# Patient Record
Sex: Female | Born: 1935 | Race: White | Hispanic: No | Smoking: Never smoker
Health system: Southern US, Community
[De-identification: ages and names within clinical notes are randomized; demographics above are authoritative.]

## PROBLEM LIST (undated history)

## (undated) DIAGNOSIS — J302 Other seasonal allergic rhinitis: Secondary | ICD-10-CM

## (undated) DIAGNOSIS — R4189 Other symptoms and signs involving cognitive functions and awareness: Secondary | ICD-10-CM

## (undated) DIAGNOSIS — S42309A Unspecified fracture of shaft of humerus, unspecified arm, initial encounter for closed fracture: Secondary | ICD-10-CM

## (undated) DIAGNOSIS — C801 Malignant (primary) neoplasm, unspecified: Secondary | ICD-10-CM

## (undated) DIAGNOSIS — M810 Age-related osteoporosis without current pathological fracture: Secondary | ICD-10-CM

## (undated) HISTORY — PX: EYE SURGERY: SHX253

## (undated) HISTORY — DX: Malignant (primary) neoplasm, unspecified: C80.1

## (undated) HISTORY — DX: Other seasonal allergic rhinitis: J30.2

## (undated) HISTORY — DX: Unspecified fracture of shaft of humerus, unspecified arm, initial encounter for closed fracture: S42.309A

## (undated) HISTORY — DX: Other symptoms and signs involving cognitive functions and awareness: R41.89

## (undated) HISTORY — PX: JOINT REPLACEMENT: SHX530

## (undated) HISTORY — PX: BACK SURGERY: SHX140

## (undated) HISTORY — PX: BREAST SURGERY: SHX581

## (undated) HISTORY — PX: APPENDECTOMY: SHX54

## (undated) HISTORY — DX: Age-related osteoporosis without current pathological fracture: M81.0

## (undated) HISTORY — PX: MASTECTOMY: SHX3

---

## 2014-12-12 ENCOUNTER — Encounter (INDEPENDENT_AMBULATORY_CARE_PROVIDER_SITE_OTHER): Payer: Self-pay | Admitting: Ophthalmology

## 2015-12-10 DIAGNOSIS — H43813 Vitreous degeneration, bilateral: Secondary | ICD-10-CM | POA: Diagnosis not present

## 2015-12-10 DIAGNOSIS — H353213 Exudative age-related macular degeneration, right eye, with inactive scar: Secondary | ICD-10-CM | POA: Diagnosis not present

## 2015-12-10 DIAGNOSIS — H35372 Puckering of macula, left eye: Secondary | ICD-10-CM | POA: Diagnosis not present

## 2015-12-10 DIAGNOSIS — H353221 Exudative age-related macular degeneration, left eye, with active choroidal neovascularization: Secondary | ICD-10-CM | POA: Diagnosis not present

## 2015-12-20 ENCOUNTER — Ambulatory Visit (INDEPENDENT_AMBULATORY_CARE_PROVIDER_SITE_OTHER): Payer: Medicare Other | Admitting: Family Medicine

## 2015-12-20 VITALS — BP 110/62 | HR 65 | Temp 98.4°F | Resp 14 | Ht 66.0 in | Wt 138.0 lb

## 2015-12-20 DIAGNOSIS — I951 Orthostatic hypotension: Secondary | ICD-10-CM | POA: Diagnosis not present

## 2015-12-20 DIAGNOSIS — R42 Dizziness and giddiness: Secondary | ICD-10-CM

## 2015-12-20 LAB — POCT CBC
Granulocyte percent: 59.7 %G (ref 37–80)
HCT, POC: 37.7 % (ref 37.7–47.9)
HEMOGLOBIN: 13.1 g/dL (ref 12.2–16.2)
LYMPH, POC: 1.6 (ref 0.6–3.4)
MCH, POC: 30.4 pg (ref 27–31.2)
MCHC: 34.7 g/dL (ref 31.8–35.4)
MCV: 87.6 fL (ref 80–97)
MID (cbc): 0.5 (ref 0–0.9)
MPV: 7.7 fL (ref 0–99.8)
PLATELET COUNT, POC: 194 10*3/uL (ref 142–424)
POC Granulocyte: 3 (ref 2–6.9)
POC LYMPH PERCENT: 30.4 %L (ref 10–50)
POC MID %: 9.9 % (ref 0–12)
RBC: 4.3 M/uL (ref 4.04–5.48)
RDW, POC: 14 %
WBC: 5.1 10*3/uL (ref 4.6–10.2)

## 2015-12-20 LAB — BASIC METABOLIC PANEL
BUN: 16 mg/dL (ref 7–25)
CHLORIDE: 101 mmol/L (ref 98–110)
CO2: 26 mmol/L (ref 20–31)
CREATININE: 0.73 mg/dL (ref 0.60–0.88)
Calcium: 9.9 mg/dL (ref 8.6–10.4)
Glucose, Bld: 139 mg/dL — ABNORMAL HIGH (ref 65–99)
POTASSIUM: 4.1 mmol/L (ref 3.5–5.3)
SODIUM: 137 mmol/L (ref 135–146)

## 2015-12-20 LAB — POC MICROSCOPIC URINALYSIS (UMFC)

## 2015-12-20 LAB — POCT URINALYSIS DIP (MANUAL ENTRY)
BILIRUBIN UA: NEGATIVE
BILIRUBIN UA: NEGATIVE
Blood, UA: NEGATIVE
Glucose, UA: NEGATIVE
LEUKOCYTES UA: NEGATIVE
NITRITE UA: NEGATIVE
Spec Grav, UA: 1.02
Urobilinogen, UA: 0.2
pH, UA: 6

## 2015-12-20 MED ORDER — ONDANSETRON 4 MG PO TBDP
4.0000 mg | ORAL_TABLET | Freq: Once | ORAL | Status: AC
  Administered 2015-12-20: 4 mg via ORAL

## 2015-12-20 MED ORDER — MECLIZINE HCL 25 MG PO TABS: 12.5000 mg | ORAL_TABLET | Freq: Three times a day (TID) | ORAL | Status: DC | PRN

## 2015-12-20 NOTE — Patient Instructions (Addendum)
IF you received an x-ray today, you will receive an invoice from [facility]. Please contact [facility] at [phone] with questions or concerns regarding your invoice.   IF you received labwork today, you will receive an invoice from [employer]. Please contact [facility] at [phone] with questions or concerns regarding your invoice.   Our billing staff will not be able to assist you with questions regarding bills from these companies.  You will be contacted with the lab results as soon as they are available. The fastest way to get your results is to activate your My Chart account. Instructions are located on the last page of this paperwork. If you have not heard from [place] regarding the results in 2 weeks, please contact this office. Vertigo Vertigo means you feel like you or your surroundings are moving when they are not. Vertigo can be dangerous if it occurs when you are at work, driving, or performing difficult activities.  CAUSES  Vertigo occurs when there is a conflict of signals sent to your brain from the visual and sensory systems in your body. There are many different causes of vertigo, including:  Infections, especially in the inner ear.  A bad reaction to a drug or misuse of alcohol and medicines.  Withdrawal from drugs or alcohol.  Rapidly changing positions, such as lying down or rolling over in bed.  A migraine headache.  Decreased blood flow to the brain.  Increased pressure in the brain from a head injury, infection, tumor, or bleeding. SYMPTOMS  You may feel as though the world is spinning around or you are falling to the ground. Because your balance is upset, vertigo can cause nausea and vomiting. You may have involuntary eye movements (nystagmus). DIAGNOSIS  Vertigo is usually diagnosed by physical exam. If the cause of your vertigo is unknown, your caregiver may perform imaging tests, such as an MRI scan (magnetic  resonance imaging). TREATMENT  Most cases of vertigo resolve on their own, without treatment. Depending on the cause, your caregiver may prescribe certain medicines. If your vertigo is related to body position issues, your caregiver may recommend movements or procedures to correct the problem. In rare cases, if your vertigo is caused by certain inner ear problems, you may need surgery. HOME CARE INSTRUCTIONS   Follow your caregiver's instructions.  Avoid driving.  Avoid operating heavy machinery.  Avoid performing any tasks that would be dangerous to you or others during a vertigo episode.  Tell your caregiver if you notice that certain medicines seem to be causing your vertigo. Some of the medicines used to treat vertigo episodes can actually make them worse in some people. SEEK IMMEDIATE MEDICAL CARE IF:   Your medicines do not relieve your vertigo or are making it worse.  You develop problems with talking, walking, weakness, or using your arms, hands, or legs.  You develop severe headaches.  Your nausea or vomiting continues or gets worse.  You develop visual changes.  A family member notices behavioral changes.  Your condition gets worse. MAKE SURE YOU:  Understand these instructions.  Will watch your condition.  Will get help right away if you are not doing well or get worse.   This information is not intended to replace advice given to you by your health care provider. Make sure you discuss any questions you have with your health care provider.   Document Released: 07/07/2005 Document Revised: 12/20/2011 Document Reviewed: 01/20/2015 Elsevier Interactive Patient Education 2016 Elsevier Inc.  Orthostatic Hypotension  Orthostatic hypotension is a sudden drop in blood pressure. It happens when you quickly stand up from a seated or lying position. You may feel dizzy or light-headed. This can last for just a few seconds or for up to a few minutes. It is usually not a  serious problem. However, if this happens frequently or gets worse, it can be a sign of something more serious. CAUSES  Different things can cause orthostatic hypotension, including:   Loss of body fluids (dehydration).  Medicines that lower blood pressure.  Sudden changes in posture, such as standing up quickly after you have been sitting or lying down.  Taking too much of your medicine. SIGNS AND SYMPTOMS   Light-headedness or dizziness.   Fainting or near-fainting.   A fast heart rate.   Weakness.   Feeling tired (fatigue).  DIAGNOSIS  Your health care provider may do several things to help diagnose your condition and identify the cause. These may include:   Taking a medical history and doing a physical exam.  Checking your blood pressure. Your health care provider will check your blood pressure when you are:  Lying down.  Sitting.  Standing.  Using tilt table testing. In this test, you lie down on a table that moves from a lying position to a standing position. You will be strapped onto the table. This test monitors your blood pressure and heart rate when you are in different positions. TREATMENT  Treatment will vary depending on the cause. Possible treatments include:   Changing the dosage of your medicines.  Wearing compression stockings on your lower legs.  Standing up slowly after sitting or lying down.  Eating more salt.  Eating frequent, small meals.  In some cases, getting IV fluids.  Taking medicine to enhance fluid retention. HOME CARE INSTRUCTIONS  Only take over-the-counter or prescription medicines as directed by your health care provider.  Follow your health care provider's instructions for changing the dosage of your current medicines.  Do not stop or adjust your medicine on your own.  Stand up slowly after sitting or lying down. This allows your body to adjust to the different position.  Wear compression stockings as  directed.  Eat extra salt as directed.  Do not add extra salt to your diet unless directed to by your health care provider.  Eat frequent, small meals.  Avoid standing suddenly after eating.  Avoid hot showers or excessive heat as directed by your health care provider.  Keep all follow-up appointments. SEEK MEDICAL CARE IF:  You continue to feel dizzy or light-headed after standing.  You feel groggy or confused.  You feel cold, clammy, or sick to your stomach (nauseous).  You have blurred vision.  You feel short of breath. SEEK IMMEDIATE MEDICAL CARE IF:   You faint after standing.  You have chest pain.  You have difficulty breathing.   You lose feeling or movement in your arms or legs.   You have slurred speech or difficulty talking, or you are unable to talk.  MAKE SURE YOU:   Understand these instructions.  Will watch your condition.  Will get help right away if you are not doing well or get worse.   This information is not intended to replace advice given to you by your health care provider. Make sure you discuss any questions you have with your health care provider.   Document Released: 09/17/2002 Document Revised: 10/02/2013 Document Reviewed: 07/20/2013 Elsevier Interactive Patient Education 2016 Elsevier Inc. Dehydration Dehydration is when  you lose more fluids from the body than you take in. Vital organs such as the kidneys, brain, and heart cannot function without a proper amount of fluids and salt. Any loss of fluids from the body can cause dehydration.  Older adults are at a higher risk of dehydration than younger adults. As we age, our bodies are less able to conserve water and do not respond to temperature changes as well. Also, older adults do not become thirsty as easily or quickly. Because of this, older adults often do not realize they need to increase fluids to avoid dehydration.  CAUSES   Vomiting.  Diarrhea.  Excessive  sweating.  Excessive urination.  Fever.  Certain medicines, such as blood pressure medicines called diuretics.  Poorly controlled blood sugars. SIGNS AND SYMPTOMS  Mild dehydration:  Thirst.  Dry lips.  Slightly dry mouth. Moderate dehydration:  Very dry mouth.  Sunken eyes.  Skin does not bounce back quickly when lightly pinched and released.  Dark urine and decreased urine production.  Decreased tear production.  Headache. Severe dehydration:  Very dry mouth.  Extreme thirst.  Rapid, weak pulse (more than 100 beats per minute at rest).  Cold hands and feet.  Not able to sweat in spite of heat.  Rapid breathing.  Blue lips.  Confusion and lethargy.  Difficulty being awakened.  Minimal urine production.  No tears. DIAGNOSIS  Your health care provider will diagnose dehydration based on your symptoms and your exam. Blood and urine tests will help confirm the diagnosis. The diagnostic evaluation should also identify the cause of dehydration. TREATMENT  Treatment of mild or moderate dehydration can often be done at home by increasing the amount of fluids that you drink. It is best to drink small amounts of fluid more often. Drinking too much at one time can make vomiting worse. Severe dehydration needs to be treated at the hospital. You may be given IV fluids that contain water and electrolytes. HOME CARE INSTRUCTIONS   Ask your health care provider about specific rehydration instructions.  Drink enough fluids to keep your urine clear or pale yellow.  Drink small amounts frequently if you have nausea and vomiting.  Eat as you normally do.  Avoid:  Foods or drinks high in sugar.  Carbonated drinks.  Juice.  Extremely hot or cold fluids.  Drinks with caffeine.  Fatty, greasy foods.  Alcohol.  Tobacco.  Overeating.  Gelatin desserts.  Wash your hands well to avoid spreading bacteria and viruses.  Only take over-the-counter or  prescription medicines for pain, discomfort, or fever as directed by your health care provider.  Ask your health care provider if you should continue all prescribed and over-the-counter medicines.  Keep all follow-up appointments with your health care provider. SEEK MEDICAL CARE IF:  You have abdominal pain, and it increases or stays in one area (localizes).  You have a rash, stiff neck, or severe headache.  You are irritable, sleepy, or difficult to awaken.  You are weak, dizzy, or extremely thirsty.  You have a fever. SEEK IMMEDIATE MEDICAL CARE IF:   You are unable to keep fluids down, or you get worse despite treatment.  You have frequent episodes of vomiting or diarrhea.  You have blood or green matter (bile) in your vomit.  You have blood in your stool, or your stool looks black and tarry.  You have not urinated in 6-8 hours, or you have only urinated a small amount of very dark urine.  You faint. MAKE  SURE YOU:   Understand these instructions.  Will watch your condition.  Will get help right away if you are not doing well or get worse.   This information is not intended to replace advice given to you by your health care provider. Make sure you discuss any questions you have with your health care provider.   Document Released: 12/18/2003 Document Revised: 10/02/2013 Document Reviewed: 06/04/2013 Elsevier Interactive Patient Education [employer].

## 2015-12-20 NOTE — Progress Notes (Addendum)
Subjective:  By signing my name below, I, Raven Small, attest that this documentation has been prepared under the direction and in the presence of Delman Cheadle, MD.  Electronically Signed: Thea Alken, ED Scribe. 12/20/2015. 1:34 PM.   Patient ID: Rebecca Munoz, female    DOB: [birth year], 82 y.o.   MRN: [id number]  HPI Chief Complaint  Patient presents with  . Dizziness    1 day  . Fatigue    light headed last night    HPI Comments: Rebecca Munoz is a 82 y.o. female who presents to the [facility] complaining of dizziness that began yesterday. Pt states while exiting an elevator yesterday her legs felt "rubberish" and felt the need to grab the wall for stability. After she got up out of bed this morning she had worsening dizziness. She still has a dizzy sensation but not as bad as episode this morning. Dizziness is not positional. It does make her somewhat nausea.  She also reports some fatigue.  She denies fever, chills, decrease appetite, muffled hearing, CP, palpitation, SOB, urinary symptoms. Pt is healthy otherwise without medical problems. She does not take any prescription medication.    There are no active problems to display for this patient.  Past Medical History  Diagnosis Date  . Cancer [facility])     breast  . Osteoporosis    Past Surgical History  Procedure Laterality Date  . Eye surgery    . Brain surgery Right     lumpectomy  . Back surgery    . Appendectomy    . Joint replacement     No Known Allergies Prior to Admission medications   Medication Sig Start Date End Date Taking? Authorizing Provider  aspirin 81 MG tablet Take 81 mg by mouth daily.   Yes Historical Provider, MD  ibuprofen (ADVIL,MOTRIN) 200 MG tablet Take 200 mg by mouth every 6 (six) hours as needed.   Yes Historical Provider, MD  Multiple Vitamins-Minerals (MULTIVITAMIN WITH MINERALS) tablet Take 1 tablet by mouth daily.   Yes Historical Provider, MD   Social History   Social  History  . Marital Status: Widowed    Spouse Name: N/A  . Number of Children: N/A  . Years of Education: N/A   Occupational History  . Not on file.   Social History Main Topics  . Smoking status: Never Smoker   . Smokeless tobacco: Not on file  . Alcohol Use: No  . Drug Use: No  . Sexual Activity: Not on file   Other Topics Concern  . Not on file   Social History Narrative  . No narrative on file   Review of Systems  Constitutional: Positive for fatigue. Negative for fever, chills and appetite change.  HENT: Negative for ear pain and hearing loss.   Respiratory: Negative for chest tightness and shortness of breath.   Cardiovascular: Negative for chest pain and palpitations.  Gastrointestinal: Positive for nausea. Negative for vomiting.  Genitourinary: Negative for difficulty urinating.  Neurological: Positive for dizziness.   Objective:   Physical Exam  Constitutional: She is oriented to person, place, and time. She appears well-developed and well-nourished. No distress.  HENT:  Head: Normocephalic and atraumatic.  Right Ear: Hearing, tympanic membrane, external ear and ear canal normal.  Left Ear: Hearing, tympanic membrane, external ear and ear canal normal.  Nose: Nose normal.  Mouth/Throat: Oropharynx is clear and moist.  Eyes: Conjunctivae and EOM are normal.  Neck: Normal range of motion. Neck  supple. No thyromegaly present.  Cardiovascular: Normal rate, regular rhythm and normal heart sounds.   No murmur heard. Pulmonary/Chest: Effort normal.  Abdominal: Soft. Bowel sounds are normal. She exhibits no distension. There is no tenderness.  Musculoskeletal: Normal range of motion.  Lymphadenopathy:    She has no cervical adenopathy.  Neurological: She is alert and oriented to person, place, and time.  Skin: Skin is warm and dry.  Psychiatric: She has a normal mood and affect. Her behavior is normal.  Nursing note and vitals reviewed.  Filed Vitals:   12/20/15  1315  BP: 110/62  Pulse: 65  Temp: 98.4 F (36.9 C)  TempSrc: Oral  Resp: 14  Height: 5\' 6"  (1.676 m)  Weight: 138 lb (62.596 kg)  SpO2: 96%   Results for orders placed or performed in visit on 12/20/15  POCT urinalysis dipstick  Result Value Ref Range   Color, UA yellow yellow   Clarity, UA clear clear   Glucose, UA negative negative   Bilirubin, UA negative negative   Ketones, POC UA negative negative   Spec Grav, UA 1.020    Blood, UA negative negative   pH, UA 6.0    Protein Ur, POC trace (A) negative   Urobilinogen, UA 0.2    Nitrite, UA Negative Negative   Leukocytes, UA Negative Negative  POCT Microscopic Urinalysis ([facility])  Result Value Ref Range   WBC,UR,HPF,POC None None WBC/hpf   RBC,UR,HPF,POC None None RBC/hpf   Bacteria Few (A) None, Too numerous to count   Mucus Present (A) Absent   Epithelial Cells, UR Per Microscopy Few (A) None, Too numerous to count cells/hpf   Crystals FEW   POCT CBC  Result Value Ref Range   WBC 5.1 4.6 - 10.2 K/uL   Lymph, poc 1.6 0.6 - 3.4   POC LYMPH PERCENT 30.4 10 - 50 %L   MID (cbc) 0.5 0 - 0.9   POC MID % 9.9 0 - 12 %M   POC Granulocyte 3.0 2 - 6.9   Granulocyte percent 59.7 37 - 80 %G   RBC 4.30 4.04 - 5.48 M/uL   Hemoglobin 13.1 12.2 - 16.2 g/dL   HCT, POC 37.7 37.7 - 47.9 %   MCV 87.6 80 - 97 fL   MCH, POC 30.4 27 - 31.2 pg   MCHC 34.7 31.8 - 35.4 g/dL   RDW, POC 14.0 %   Platelet Count, POC 194 142 - 424 K/uL   MPV 7.7 0 - 99.8 fL   Orthostatic VS for the past 24 hrs:  BP- Lying Pulse- Lying BP- Sitting Pulse- Sitting BP- Standing at 0 minutes Pulse- Standing at 0 minutes  12/20/15 1415 147/70 mmHg 60 136/72 mmHg 64 119/72 mmHg 74    EKG-Normal sinus rhythm no ischemic change.   Assessment & Plan:   1. Dizziness and giddiness   2. Orthostatic hypotension   3. Vertigo - suspect benign BPPV - sxs mild and improved with zofran in office today. Start sched mecliizine bid x sev d and can increase prn. Recheck  in sev d.    Orders Placed This Encounter  Procedures  . Basic metabolic panel    Order Specific Question:  Has the patient fasted?    Answer:  No  . Orthostatic vital signs  . POCT urinalysis dipstick  . POCT Microscopic Urinalysis ([facility])  . POCT CBC  . EKG 12-Lead    Meds ordered this encounter  Medications  . ibuprofen (ADVIL,MOTRIN) 200 MG tablet  Sig: Take 200 mg by mouth every 6 (six) hours as needed.  . Multiple Vitamins-Minerals (MULTIVITAMIN WITH MINERALS) tablet    Sig: Take 1 tablet by mouth daily.  Marland Kitchen aspirin 81 MG tablet    Sig: Take 81 mg by mouth daily.  . ondansetron (ZOFRAN-ODT) disintegrating tablet 4 mg    Sig:   . meclizine (ANTIVERT) 25 MG tablet    Sig: Take 0.5-1 tablets (12.5-25 mg total) by mouth 3 (three) times daily as needed for dizziness.    Dispense:  30 tablet    Refill:  0    I personally performed the services described in this documentation, which was scribed in my presence. The recorded information has been reviewed and considered, and addended by me as needed.  Delman Cheadle, MD MPH

## 2015-12-22 ENCOUNTER — Encounter: Payer: Self-pay | Admitting: Family Medicine

## 2015-12-24 ENCOUNTER — Ambulatory Visit (INDEPENDENT_AMBULATORY_CARE_PROVIDER_SITE_OTHER): Payer: Medicare Other | Admitting: Physician Assistant

## 2015-12-24 VITALS — BP 144/80 | HR 74 | Temp 98.4°F | Resp 17 | Ht 66.0 in | Wt 141.0 lb

## 2015-12-24 DIAGNOSIS — I951 Orthostatic hypotension: Secondary | ICD-10-CM | POA: Diagnosis not present

## 2015-12-24 DIAGNOSIS — R42 Dizziness and giddiness: Secondary | ICD-10-CM | POA: Diagnosis not present

## 2015-12-24 NOTE — Patient Instructions (Addendum)
I would stop the meclizine and now just use it if you need it for dizziness.  You are doing do much better.  Continue using the walker until you feel your leg steadiness is back.  If you have the symptoms return please RTC because at that time we may want to do further testing.  IF you received an x-ray today, you will receive an invoice from [facility]. Please contact [facility] at [phone] with questions or concerns regarding your invoice.   IF you received labwork today, you will receive an invoice from [employer]. Please contact [facility] at [phone] with questions or concerns regarding your invoice.   Our billing staff will not be able to assist you with questions regarding bills from these companies.  You will be contacted with the lab results as soon as they are available. The fastest way to get your results is to activate your My Chart account. Instructions are located on the last page of this paperwork. If you have not heard from [place] regarding the results in 2 weeks, please contact this office.

## 2015-12-24 NOTE — Progress Notes (Signed)
Rebecca Munoz  MRN: [id number] DOB: 10/14/1920  Subjective:  Pt presents to clinic for a recheck.  She feels better. She is not having more more dizzy spells but her legs still feel rubbery.  She is using a walker which she has never used before and it is "cramping her style" but she would rather use it and be safe. She has been doing treadmill walking the last several mornings - she has to hold on but she has been able to do her daily exercise.  She is still having no URI symptoms.  Her nausea has resolved.  She has been using the medication and she has been tolerating it ok but she is not sure when to stop it.  She is having no paresthesias or numbness in legs or arms.  No headaches.  She has a history right hip irritation and she sits on ice each day.  She feels like her legs are both rubbery even with that history of her leg/hip injury.    There are no active problems to display for this patient.   Current Outpatient Prescriptions on File Prior to Visit  Medication Sig Dispense Refill  . aspirin 81 MG tablet Take 81 mg by mouth daily.    Marland Kitchen ibuprofen (ADVIL,MOTRIN) 200 MG tablet Take 200 mg by mouth every 6 (six) hours as needed. Reported on 12/24/2015    . meclizine (ANTIVERT) 25 MG tablet Take 0.5-1 tablets (12.5-25 mg total) by mouth 3 (three) times daily as needed for dizziness. 30 tablet 0  . Multiple Vitamins-Minerals (MULTIVITAMIN WITH MINERALS) tablet Take 1 tablet by mouth daily.     No current facility-administered medications on file prior to visit.    No Known Allergies  Review of Systems  HENT: Negative.   Neurological: Positive for weakness (legs only). Negative for dizziness, syncope and headaches.   Objective:  BP 144/80 mmHg  Pulse 74  Temp(Src) 98.4 F (36.9 C) (Oral)  Resp 17  Ht 5\' 6"  (1.676 m)  Wt 141 lb (63.957 kg)  BMI 22.77 kg/m2  SpO2 95%  Physical Exam  Constitutional: She is oriented to person, place, and time and well-developed, well-nourished,  and in no distress.  HENT:  Head: Normocephalic and atraumatic.  Right Ear: Hearing and external ear normal.  Left Ear: Hearing and external ear normal.  Eyes: Conjunctivae are normal.  Neck: Normal range of motion.  Cardiovascular: Normal rate, regular rhythm and normal heart sounds.   No murmur heard. Pulmonary/Chest: Effort normal and breath sounds normal. She has no wheezes.  Neurological: She is alert and oriented to person, place, and time. She has normal motor skills. She has a normal Romberg Test. She shows no pronator drift. Gait normal.  Skin: Skin is warm and dry.  Psychiatric: Mood, memory, affect and judgment normal.  Vitals reviewed.   Orthostatic VS for the past 24 hrs:  BP- Lying Pulse- Lying BP- Sitting Pulse- Sitting BP- Standing at 0 minutes Pulse- Standing at 0 minutes  12/24/15 1027 144/71 mmHg 60 140/81 mmHg 64 140/69 mmHg 72    Assessment and Plan :  Dizziness and giddiness  Orthostatic hypotension - Plan: Orthostatic vital signs   Reviewed normal labs with the patient.  She is much better today.  She asks about a CT scan and I think currently the risk of radiation is more than the possibility of finding something esp since she has a normal exam and her symptoms of dizziness have resolve and the "rubbery legs" are  better.  She will now she the meclizine prn and if the dizzy feeling returns she will RTC and at that time we may decided that a CT is necessary.  She understands and agrees with the plan.    Windell Hummingbird PA-C  [facility] 12/24/2015 10:47 AM

## 2016-02-03 DIAGNOSIS — H3530 Unspecified macular degeneration: Secondary | ICD-10-CM | POA: Diagnosis not present

## 2016-02-03 DIAGNOSIS — K219 Gastro-esophageal reflux disease without esophagitis: Secondary | ICD-10-CM | POA: Diagnosis not present

## 2016-02-03 DIAGNOSIS — R32 Unspecified urinary incontinence: Secondary | ICD-10-CM | POA: Diagnosis not present

## 2016-02-03 DIAGNOSIS — G3184 Mild cognitive impairment, so stated: Secondary | ICD-10-CM | POA: Diagnosis not present

## 2016-02-03 DIAGNOSIS — M545 Low back pain: Secondary | ICD-10-CM | POA: Diagnosis not present

## 2016-02-03 DIAGNOSIS — M5412 Radiculopathy, cervical region: Secondary | ICD-10-CM | POA: Diagnosis not present

## 2016-02-03 DIAGNOSIS — L570 Actinic keratosis: Secondary | ICD-10-CM | POA: Diagnosis not present

## 2016-02-11 DIAGNOSIS — E559 Vitamin D deficiency, unspecified: Secondary | ICD-10-CM | POA: Diagnosis not present

## 2016-02-11 DIAGNOSIS — Z79899 Other long term (current) drug therapy: Secondary | ICD-10-CM | POA: Diagnosis not present

## 2016-03-17 DIAGNOSIS — H35372 Puckering of macula, left eye: Secondary | ICD-10-CM | POA: Diagnosis not present

## 2016-03-17 DIAGNOSIS — H43813 Vitreous degeneration, bilateral: Secondary | ICD-10-CM | POA: Diagnosis not present

## 2016-03-17 DIAGNOSIS — H353213 Exudative age-related macular degeneration, right eye, with inactive scar: Secondary | ICD-10-CM | POA: Diagnosis not present

## 2016-03-17 DIAGNOSIS — H353222 Exudative age-related macular degeneration, left eye, with inactive choroidal neovascularization: Secondary | ICD-10-CM | POA: Diagnosis not present

## 2016-04-06 DIAGNOSIS — D229 Melanocytic nevi, unspecified: Secondary | ICD-10-CM | POA: Diagnosis not present

## 2016-04-06 DIAGNOSIS — H9190 Unspecified hearing loss, unspecified ear: Secondary | ICD-10-CM | POA: Diagnosis not present

## 2016-04-06 DIAGNOSIS — M5416 Radiculopathy, lumbar region: Secondary | ICD-10-CM | POA: Diagnosis not present

## 2016-04-06 DIAGNOSIS — M545 Low back pain: Secondary | ICD-10-CM | POA: Diagnosis not present

## 2016-04-06 DIAGNOSIS — R413 Other amnesia: Secondary | ICD-10-CM | POA: Diagnosis not present

## 2016-04-06 DIAGNOSIS — K319 Disease of stomach and duodenum, unspecified: Secondary | ICD-10-CM | POA: Diagnosis not present

## 2016-04-06 DIAGNOSIS — H3530 Unspecified macular degeneration: Secondary | ICD-10-CM | POA: Diagnosis not present

## 2016-04-06 DIAGNOSIS — Z7409 Other reduced mobility: Secondary | ICD-10-CM | POA: Diagnosis not present

## 2016-04-22 DIAGNOSIS — L820 Inflamed seborrheic keratosis: Secondary | ICD-10-CM | POA: Diagnosis not present

## 2016-04-22 DIAGNOSIS — Z79899 Other long term (current) drug therapy: Secondary | ICD-10-CM | POA: Diagnosis not present

## 2016-04-22 DIAGNOSIS — D229 Melanocytic nevi, unspecified: Secondary | ICD-10-CM | POA: Diagnosis not present

## 2016-04-22 DIAGNOSIS — M545 Low back pain: Secondary | ICD-10-CM | POA: Diagnosis not present

## 2016-06-04 ENCOUNTER — Ambulatory Visit (INDEPENDENT_AMBULATORY_CARE_PROVIDER_SITE_OTHER): Payer: Medicare Other

## 2016-06-04 ENCOUNTER — Ambulatory Visit (INDEPENDENT_AMBULATORY_CARE_PROVIDER_SITE_OTHER): Payer: Medicare Other | Admitting: Physician Assistant

## 2016-06-04 ENCOUNTER — Other Ambulatory Visit: Payer: Self-pay

## 2016-06-04 VITALS — BP 122/52 | HR 44 | Temp 98.1°F | Resp 16 | Ht 65.0 in | Wt 140.8 lb

## 2016-06-04 DIAGNOSIS — M533 Sacrococcygeal disorders, not elsewhere classified: Secondary | ICD-10-CM | POA: Diagnosis not present

## 2016-06-04 DIAGNOSIS — M545 Low back pain: Secondary | ICD-10-CM

## 2016-06-04 DIAGNOSIS — S79911A Unspecified injury of right hip, initial encounter: Secondary | ICD-10-CM | POA: Diagnosis not present

## 2016-06-04 MED ORDER — MELOXICAM 7.5 MG PO TABS: 7.5000 mg | ORAL_TABLET | Freq: Every day | ORAL | 0 refills | Status: DC

## 2016-06-04 MED ORDER — METAXALONE 400 MG PO TABS: 400.0000 mg | ORAL_TABLET | Freq: Three times a day (TID) | ORAL | 0 refills | Status: DC

## 2016-06-04 NOTE — Progress Notes (Addendum)
[facility] [address], [place] [address] [phone] By signing my name below, I, Mesha Guinyard, attest that this documentation has been prepared under the direction and in the presence of Treatment Team:  Attending Provider: Wardell Honour, MD Physician Assistant: Joretta Bachelor, [place].  Electronically Signed: Verlee Monte, Medical Scribe. 06/04/16. 2:36 PM.  Date:  06/04/2016   Name:  Rebecca Munoz   DOB:  09-23-1921   MRN:  [id number]  PCP:  No PCP Per Patient   Chief Complaint  Patient presents with   Hip Pain    Right hip   History of Present Illness:  Rebecca Munoz is a 82 y.o. female patient who presents to [facility] complaining of old hip injury. Pt fell out of a pick up truck and landed on cement when she was 82 y/o. Pt mentions she wasn't "terribly" injured then and wasn't bed ridden. Pt mentions she had a broken hip when she was 82 y/o, 2 back surgeries on that side that inserted pins, and her spine is fused at L4-5 around 1973. Pt reports pain that radiates to her right knee, an increase in instability in her when she walks. Pt reports right knee pain but mentions she's had injuries to her knees at the past. Pt has been using a walker more frequently this month to maintain her balance, and rolling over is a problem at night. 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 Pt has been going to a chiropractor for her symptoms, but thinks she needs more help.   There are no active problems to display for this patient.   Past Medical History:  Diagnosis Date   Cancer [facility])    breast   Osteoporosis     Past Surgical History:  Procedure Laterality Date   APPENDECTOMY     BACK SURGERY     BRAIN SURGERY Right    lumpectomy   EYE SURGERY     JOINT REPLACEMENT      Social History  Substance Use Topics   Smoking status: Never Smoker   Smokeless tobacco: Never Used    Alcohol use No    History reviewed. No pertinent family history.  No Known Allergies  Medication list has been reviewed and updated.  Current Outpatient Prescriptions on File Prior to Visit  Medication Sig Dispense Refill   aspirin 81 MG tablet Take 81 mg by mouth daily.     Multiple Vitamins-Minerals (MULTIVITAMIN WITH MINERALS) tablet Take 1 tablet by mouth daily.     meclizine (ANTIVERT) 25 MG tablet Take 0.5-1 tablets (12.5-25 mg total) by mouth 3 (three) times daily as needed for dizziness. (Patient not taking: Reported on 06/04/2016) 30 tablet 0   No current facility-administered medications on file prior to visit.     Review of Systems  Musculoskeletal: Positive for back pain.       Arthralgias  ROS otherwise unremarkable unless listed above.   Physical Examination: BP (!) 122/52 (BP Location: Right Arm, Patient Position: Sitting, Cuff Size: Small)    Pulse (!) 44    Temp 98.1 F (36.7 C) (Oral)    Resp 16    Ht 5\' 5"  (1.651 m)    Wt 140 lb 12.8 oz (63.9 kg)    SpO2 96%    BMI 23.43 kg/m  Ideal Body Weight: @FLOWAMB [id number]  Physical Exam  Constitutional: She appears well-developed and well-nourished. No distress.  HENT:  Head:  Normocephalic and atraumatic.  Eyes: Conjunctivae are normal.  Neck: Neck supple.  Cardiovascular: Normal rate and intact distal pulses.   Pulses:      Dorsalis pedis pulses are 2+ on the right side, and 2+ on the left side.  Pulmonary/Chest: Effort normal.  Musculoskeletal:  Tenderness along the SI joint on the right side Sacral tenderness  Neurological: She is alert.  Skin: Skin is warm and dry.  Psychiatric: She has a normal mood and affect. Her behavior is normal.  Nursing note and vitals reviewed.  Dg Sacrum/coccyx  Result Date: 06/04/2016 CLINICAL DATA:  Back pain. EXAM: SACRUM AND COCCYX - 2+ VIEW COMPARISON:  None. FINDINGS: No fracture.  No bone lesion. SI joints are normally spaced and aligned. Bones are diffusely  demineralized. IMPRESSION: No fracture. Electronically Signed   By: Lajean Manes M.D.   On: 06/04/2016 15:37   Dg Hip Unilat W Or W/o Pelvis 2-3 Views Right  Result Date: 06/04/2016 CLINICAL DATA:  82 y.o. female patient who presents to [facility] complaining of old hip injury. Pt fell out of a pick up truck and landed on cement when she was 82 y/o. EXAM: DG HIP (WITH OR WITHOUT PELVIS) 2-3V RIGHT COMPARISON:  None. FINDINGS: No acute fracture.  No bone lesion. An intra medullary rod supports 2 screws, which reduced the old proximal right femur fracture. The fractures well-healed. The orthopedic hardware appears well-seated without evidence of loosening. Hip joints, SI joints and symphysis pubis are normally aligned. Bones are diffusely demineralized. IMPRESSION: 1. No fracture or acute finding. 2. No evidence of loosening of the right proximal femur orthopedic hardware. 3. No significant hip joint abnormality. Electronically Signed   By: Lajean Manes M.D.   On: 06/04/2016 15:39   Assessment and Plan: Rebecca Munoz is a 82 y.o. female who is here today for low back pain. Advised nsaid use short term, and skelaxin.  Advised precaution.  She will continue with physical therapy at her assistant living quarters.  If sxs do not improve, we can refer to ortho.  Low back pain without sciatica, unspecified back pain laterality - Plan: DG Sacrum/Coccyx, DG HIP UNILAT W OR W/O PELVIS 2-3 VIEWS RIGHT, metaxalone (SKELAXIN) 400 MG tablet, meloxicam (MOBIC) 7.5 MG tablet, CANCELED: DG Si Joints  Ivar Drape, PA-C [facility] 06/04/2016 2:36 PM  I personally performed the services described in this documentation, which was scribed in my presence. The recorded information has been reviewed and is accurate.

## 2016-06-04 NOTE — Patient Instructions (Addendum)
Please ice the area as you have done, 3 times per day. We can do the skelaxin.  I want you to be careful with walking or moving around quickly. You can take the mobic as needed.  Please do not take ibuprofen or naproxen when you take this.  You can take tylenol.  IF you received an x-ray today, you will receive an invoice from [facility]. Please contact [facility] at [phone] with questions or concerns regarding your invoice.   IF you received labwork today, you will receive an invoice from [employer]. Please contact [facility] at [phone] with questions or concerns regarding your invoice.   Our billing staff will not be able to assist you with questions regarding bills from these companies.  You will be contacted with the lab results as soon as they are available. The fastest way to get your results is to activate your My Chart account. Instructions are located on the last page of this paperwork. If you have not heard from [place] regarding the results in 2 weeks, please contact this office.

## 2016-06-05 ENCOUNTER — Telehealth: Payer: Self-pay

## 2016-06-05 NOTE — Telephone Encounter (Signed)
ENGLISH - Pt thinks maybe her problem you saw her for could possibly be gall bladder.  She says she is hurting on the front and back.  Please call with your thoughts on this. [phone]

## 2016-06-05 NOTE — Telephone Encounter (Signed)
This right lower back pain, is significantly lower than her gallbladder, at her upper right quadrant, very close to the breast.  This appears to be lower back pain, and we should continue to treat, and let me know if there is no improvement.

## 2016-06-07 NOTE — Telephone Encounter (Signed)
Left message for pt to call back  °

## 2016-06-08 ENCOUNTER — Telehealth: Payer: Self-pay

## 2016-06-08 NOTE — Telephone Encounter (Signed)
Patient stated the one medication is working. Patient stated she is feeling better. Patient is wondering if she have gall bladder. [phone].

## 2016-06-09 DIAGNOSIS — Z79899 Other long term (current) drug therapy: Secondary | ICD-10-CM | POA: Diagnosis not present

## 2016-06-09 NOTE — Telephone Encounter (Signed)
Left message for pt to call back  °

## 2016-06-10 NOTE — Telephone Encounter (Signed)
Left message for pt to call back  °

## 2016-06-17 ENCOUNTER — Telehealth: Payer: Self-pay

## 2016-06-17 NOTE — Telephone Encounter (Signed)
Patient is calling for x-ray results but they need to be reviewed. Please advise! [phone]

## 2016-06-18 NOTE — Telephone Encounter (Signed)
Hip, right with pelvis IMPRESSION: 1. No fracture or acute finding. 2. No evidence of loosening of the right proximal femur orthopedic hardware. 3. No significant hip joint abnormality.IMPRESSION: 1. No fracture or acute finding. 2. No evidence of loosening of the right proximal femur orthopedic hardware. 3. No significant hip joint abnormality. Sacrum IMPRESSION: No fracture.  These showed no acute abnormality or slipping of the prior surgery hardware.

## 2016-06-18 NOTE — Telephone Encounter (Signed)
Pt advised.

## 2016-06-28 ENCOUNTER — Ambulatory Visit (INDEPENDENT_AMBULATORY_CARE_PROVIDER_SITE_OTHER): Payer: Medicare Other | Admitting: Family Medicine

## 2016-06-28 VITALS — BP 122/70 | HR 60 | Temp 97.9°F | Resp 18 | Ht 65.0 in | Wt 140.0 lb

## 2016-06-28 DIAGNOSIS — L821 Other seborrheic keratosis: Secondary | ICD-10-CM | POA: Diagnosis not present

## 2016-06-28 DIAGNOSIS — D234 Other benign neoplasm of skin of scalp and neck: Secondary | ICD-10-CM | POA: Diagnosis not present

## 2016-06-28 DIAGNOSIS — L918 Other hypertrophic disorders of the skin: Secondary | ICD-10-CM

## 2016-06-28 NOTE — Patient Instructions (Addendum)
Follow-up as needed!  IF you received an x-ray today, you will receive an invoice from [facility]. Please contact [facility] at [phone] with questions or concerns regarding your invoice.   IF you received labwork today, you will receive an invoice from [employer]. Please contact [facility] at [phone] with questions or concerns regarding your invoice.   Our billing staff will not be able to assist you with questions regarding bills from these companies.  You will be contacted with the lab results as soon as they are available. The fastest way to get your results is to activate your My Chart account. Instructions are located on the last page of this paperwork. If you have not heard from [place] regarding the results in 2 weeks, please contact this office.     Cryotherapy Cryotherapy is when you put ice on your injury. Ice helps lessen pain and puffiness (swelling) after an injury. Ice works the best when you start using it in the first 24 to 48 hours after an injury. HOME CARE  Put a dry or damp towel between the ice pack and your skin.  You may press gently on the ice pack.  Leave the ice on for no more than 10 to 20 minutes at a time.  Check your skin after 5 minutes to make sure your skin is okay.  Rest at least 20 minutes between ice pack uses.  Stop using ice when your skin loses feeling (numbness).  Do not use ice on someone who cannot tell you when it hurts. This includes small children and people with memory problems (dementia). GET HELP RIGHT AWAY IF:  You have white spots on your skin.  Your skin turns blue or pale.  Your skin feels waxy or hard.  Your puffiness gets worse. MAKE SURE YOU:   Understand these instructions.  Will watch your condition.  Will get help right away if you are not doing well or get worse.   This information is not intended to replace advice given to you by your health care provider. Make  sure you discuss any questions you have with your health care provider.   Document Released: 03/15/2008 Document Revised: 12/20/2011 Document Reviewed: 05/20/2011 Elsevier Interactive Patient Education [employer].

## 2016-06-28 NOTE — Progress Notes (Signed)
Patient ID: Rebecca Munoz, female    DOB: 1921-05-18, 82 y.o.   MRN: [id number]  PCP: No PCP Per Patient  Chief Complaint  Patient presents with  . Nevus    ON NECK     Subjective:   HPI 82 year old female presents to have moles evaluated.  She reports noticing two moles and one skin tag on the right side of neck.  Denies changes in color, shape, or size. Reports that she is just "irritated" with the lesions and would like them removed.  Social History   Social History  . Marital status: Widowed    Spouse name: N/A  . Number of children: N/A  . Years of education: N/A   Occupational History  . Not on file.   Social History Main Topics  . Smoking status: Never Smoker  . Smokeless tobacco: Never Used  . Alcohol use No  . Drug use: No  . Sexual activity: Not on file   Other Topics Concern  . Not on file   Social History Narrative  . No narrative on file   History reviewed. No pertinent family history.    Review of Systems See HPI    There are no active problems to display for this patient.    Prior to Admission medications   Medication Sig Start Date End Date Taking? Authorizing Provider  aspirin 81 MG tablet Take 81 mg by mouth daily.   Yes Historical Provider, MD  meclizine (ANTIVERT) 25 MG tablet Take 0.5-1 tablets (12.5-25 mg total) by mouth 3 (three) times daily as needed for dizziness. 12/20/15  Yes Shawnee Knapp, MD  meloxicam (MOBIC) 7.5 MG tablet Take 1 tablet (7.5 mg total) by mouth daily. 06/04/16  Yes Dorian Heckle English, PA  Multiple Vitamins-Minerals (MULTIVITAMIN WITH MINERALS) tablet Take 1 tablet by mouth daily.   Yes Historical Provider, MD  metaxalone (SKELAXIN) 400 MG tablet Take 1 tablet (400 mg total) by mouth 3 (three) times daily. Patient not taking: Reported on 06/28/2016 06/04/16   Dorian Heckle English, PA   No Known Allergies     Objective:  Physical Exam  Constitutional: She is oriented to person, place, and time. She appears  well-developed and well-nourished.  HENT:  Head: Normocephalic and atraumatic.  Right Ear: External ear normal.  Left Ear: External ear normal.  Eyes: Conjunctivae and EOM are normal. Pupils are equal, round, and reactive to light.  Neck: Normal range of motion.  Cardiovascular: Normal rate, regular rhythm and normal heart sounds.   Pulmonary/Chest: Effort normal and breath sounds normal.  Musculoskeletal: Normal range of motion.  Neurological: She is alert and oriented to person, place, and time.  Skin: Skin is warm and dry.  Informed consent was obtained. The wart was treated with with cryotherapy using acetic acid applied x 3 times to the core of skin tag located on right lower neck below hairline, three other skin lesions that are seborrheic keratoses distal from the skin tag. The color changed to white with the application of acid. The patient tolerated procedure well and aftercare instructions were given to the patient.  Psychiatric: She has a normal mood and affect. Her behavior is normal. Judgment and thought content normal.     Assessment & Plan:  1. Skin tag 2. Seborrheic keratoses  Patient presents today requesting removal of a benign skin tag with application of cryotherapy.  She has two lesions which are dry and scaling distal to the skin tag which are most likely  seborrheic keratoses which also received cryotherapy.  Patient tolerated and was absent of distress during and after procedure.  Advised to follow-up if lesions become painful or show signs of infection.  Carroll Sage. Kenton Kingfisher, MSN, FNP-C [facility]

## 2016-07-16 DIAGNOSIS — H353222 Exudative age-related macular degeneration, left eye, with inactive choroidal neovascularization: Secondary | ICD-10-CM | POA: Diagnosis not present

## 2016-07-16 DIAGNOSIS — H35372 Puckering of macula, left eye: Secondary | ICD-10-CM | POA: Diagnosis not present

## 2016-07-16 DIAGNOSIS — H353213 Exudative age-related macular degeneration, right eye, with inactive scar: Secondary | ICD-10-CM | POA: Diagnosis not present

## 2016-07-16 DIAGNOSIS — H35421 Microcystoid degeneration of retina, right eye: Secondary | ICD-10-CM | POA: Diagnosis not present

## 2016-09-09 ENCOUNTER — Telehealth: Payer: Self-pay

## 2016-09-13 ENCOUNTER — Ambulatory Visit (INDEPENDENT_AMBULATORY_CARE_PROVIDER_SITE_OTHER): Payer: Medicare Other

## 2016-09-13 ENCOUNTER — Ambulatory Visit (INDEPENDENT_AMBULATORY_CARE_PROVIDER_SITE_OTHER): Payer: Medicare Other | Admitting: Physician Assistant

## 2016-09-13 VITALS — BP 120/62 | HR 93 | Temp 98.1°F | Resp 16 | Ht 65.0 in | Wt 136.0 lb

## 2016-09-13 DIAGNOSIS — M545 Low back pain: Secondary | ICD-10-CM

## 2016-09-13 DIAGNOSIS — G8929 Other chronic pain: Secondary | ICD-10-CM

## 2016-09-13 MED ORDER — ACETAMINOPHEN 325 MG PO TABS: 325.0000 mg | ORAL_TABLET | Freq: Four times a day (QID) | ORAL | 1 refills | Status: DC | PRN

## 2016-09-13 NOTE — Progress Notes (Signed)
Rebecca Munoz  MRN: [id number] DOB: October 17, 1920  Subjective:  Rebecca Munoz is a 82 y.o. female seen in office today for a chief complaint of complaining of old hip injury. Pt initially seen here for the same issue on 06/04/16. Per that note "Pt fell out of a pick up truck and landed on cement when she was 82 y/o. Pt mentions she wasn't "terribly" injured then and wasn't bed ridden. Pt mentions she had a broken hip when she was 82 y/o, 2 back surgeries on that side that inserted pins, and her spine is fused at L4-5 around 1973." Xrays of sacrum/coccyx and right hip were obtained at this visit and were both negative. She was given meloxicam and skelaxin, which she completed. She notes the pain has come back since then. She decsribes the pain as an intermittent sharp pain in her right buttocks. She notes she has to use her walker more because she is uncertain when the pain will appear. She denies weakness, urinary/bowel incontinence and saddle anesthesia. Pt has tried velcroed belt and ice pack with moderate relief. Of note, pt mentions her memory is not that good so her history reporting is variable.   Review of Systems  Constitutional: Negative for chills, diaphoresis and fever.  Respiratory: Negative for cough and shortness of breath.   Genitourinary: Negative for difficulty urinating and dysuria.  Neurological: Negative for dizziness.    There are no active problems to display for this patient.   Current Outpatient Prescriptions on File Prior to Visit  Medication Sig Dispense Refill  . aspirin 81 MG tablet Take 81 mg by mouth daily.    . Multiple Vitamins-Minerals (MULTIVITAMIN WITH MINERALS) tablet Take 1 tablet by mouth daily.    . meclizine (ANTIVERT) 25 MG tablet Take 0.5-1 tablets (12.5-25 mg total) by mouth 3 (three) times daily as needed for dizziness. (Patient not taking: Reported on 09/13/2016) 30 tablet 0  . meloxicam (MOBIC) 7.5 MG tablet Take 1 tablet (7.5 mg total) by mouth  daily. (Patient not taking: Reported on 09/13/2016) 30 tablet 0  . metaxalone (SKELAXIN) 400 MG tablet Take 1 tablet (400 mg total) by mouth 3 (three) times daily. (Patient not taking: Reported on 09/13/2016) 90 tablet 0   No current facility-administered medications on file prior to visit.     No Known Allergies   Objective:  BP 120/62   Pulse 93   Temp 98.1 F (36.7 C) (Oral)   Resp 16   Ht 5\' 5"  (1.651 m)   Wt 136 lb (61.7 kg)   SpO2 96%   BMI 22.63 kg/m   Physical Exam  Constitutional: She is oriented to person, place, and time and well-developed, well-nourished, and in no distress.  HENT:  Head: Normocephalic and atraumatic.  Eyes: Conjunctivae are normal.  Neck: Normal range of motion.  Pulmonary/Chest: Effort normal.  Musculoskeletal:       Cervical back: Normal.       Thoracic back: Normal.       Lumbar back: She exhibits no bony tenderness.       Legs: Neurological: She is alert and oriented to person, place, and time. She has normal sensation, normal strength and normal reflexes. She has a normal Straight Leg Raise Test. Gait (using walker to ambulate) abnormal.  Skin: Skin is warm and dry.  Psychiatric: Affect normal.  Vitals reviewed.  Dg Lumbar Spine Complete  Result Date: 09/13/2016 CLINICAL DATA:  Chronic right-sided low back pain. EXAM: LUMBAR SPINE - COMPLETE 4+ VIEW  COMPARISON:  None. FINDINGS: There are 5 non rib-bearing lumbar type vertebrae. Mild lumbar dextroscoliosis is noted with apex at L2. Trace anterolisthesis of L4 on L5 is likely due to moderate facet arthrosis at this level. No definite pars defects are identified. There is ankylosis across the L5-S1 disc space which may be congenital or degenerative. Mild disc space narrowing is present elsewhere throughout the lumbar spine. Moderate endplate osteophytosis is present from T12-L3. No fracture is identified. Fixation screws are partially visualized in the proximal right femur. Abdominal aortic  atherosclerosis is noted. IMPRESSION: 1. No acute osseous abnormality identified. 2. Mild diffuse lumbar disc degeneration. 3. L4-5 facet arthrosis with trace anterolisthesis. 4. Aortic atherosclerosis. Electronically Signed   By: Logan Bores M.D.   On: 09/13/2016 14:26     Assessment and Plan :  This case was precepted with Dr. Carlota Raspberry  1. Chronic right-sided low back pain, with sciatica presence unspecified -History of intermittent pain consistent with sciatica. Pt has done well on NSAIDs in the past. Due to age, will switch to tylenol.  -Pt instructed that PT may be a good option for her. She has this option available to her at her housing facility and notes she will take advantage of it. - DG Lumbar Spine Complete; Future - acetaminophen (TYLENOL) 325 MG tablet; Take 1 tablet (325 mg total) by mouth every 6 (six) hours as needed.  Dispense: 90 tablet; Refill: 1 - Ambulatory referral to Orthopedic Surgery -Return to clinic if symptoms worsen, do not improve, or as needed    Tenna Delaine PA-C  [facility] 09/13/2016 2:31 PM

## 2016-09-13 NOTE — Patient Instructions (Addendum)
Take tylenol every 6 hours for pain. You will be contacted by orthopedics. Continue doing the stretches below and using PT at your facility.   If symptoms worsen, seek care sooner.  Sciatica Rehab Ask your health care provider which exercises are safe for you. Do exercises exactly as told by your health care provider and adjust them as directed. It is normal to feel mild stretching, pulling, tightness, or discomfort as you do these exercises, but you should stop right away if you feel sudden pain or your pain gets worse.Do not begin these exercises until told by your health care provider. Stretching and range of motion exercises These exercises warm up your muscles and joints and improve the movement and flexibility of your hips and your back. These exercises also help to relieve pain, numbness, and tingling. Exercise A: Sciatic nerve glide 1. Sit in a chair with your head facing down toward your chest. Place your hands behind your back. Let your shoulders slump forward. 2. Slowly straighten one of your knees while you tilt your head back as if you are looking toward the ceiling. Only straighten your leg as far as you can without making your symptoms worse. 3. Hold for __________ seconds. 4. Slowly return to the starting position. 5. Repeat with your other leg. Repeat __________ times. Complete this exercise __________ times a day. Exercise B: Knee to chest with hip adduction and internal rotation 1. Lie on your back on a firm surface with both legs straight. 2. Bend one of your knees and move it up toward your chest until you feel a gentle stretch in your lower back and buttock. Then, move your knee toward the shoulder that is on the opposite side from your leg.  Hold your leg in this position by holding onto the front of your knee. 3. Hold for __________ seconds. 4. Slowly return to the starting position. 5. Repeat with your other leg. Repeat __________ times. Complete this exercise  __________ times a day. Exercise C: Prone extension on elbows 1. Lie on your abdomen on a firm surface. A bed may be too soft for this exercise. 2. Prop yourself up on your elbows. 3. Use your arms to help lift your chest up until you feel a gentle stretch in your abdomen and your lower back.  This will place some of your body weight on your elbows. If this is uncomfortable, try stacking pillows under your chest.  Your hips should stay down, against the surface that you are lying on. Keep your hip and back muscles relaxed. 4. Hold for __________ seconds. 5. Slowly relax your upper body and return to the starting position. Repeat __________ times. Complete this exercise __________ times a day. Strengthening exercises These exercises build strength and endurance in your back. Endurance is the ability to use your muscles for a long time, even after they get tired. Exercise D: Pelvic tilt 1. Lie on your back on a firm surface. Bend your knees and keep your feet flat. 2. Tense your abdominal muscles. Tip your pelvis up toward the ceiling and flatten your lower back into the floor.  To help with this exercise, you may place a small towel under your lower back and try to push your back into the towel. 3. Hold for __________ seconds. 4. Let your muscles relax completely before you repeat this exercise. Repeat __________ times. Complete this exercise __________ times a day. Exercise E: Alternating arm and leg raises 1. Get on your hands and knees on  a firm surface. If you are on a hard floor, you may want to use padding to cushion your knees, such as an exercise mat. 2. Line up your arms and legs. Your hands should be below your shoulders, and your knees should be below your hips. 3. Lift your left leg behind you. At the same time, raise your right arm and straighten it in front of you.  Do not lift your leg higher than your hip.  Do not lift your arm higher than your shoulder.  Keep your  abdominal and back muscles tight.  Keep your hips facing the ground.  Do not arch your back.  Keep your balance carefully, and do not hold your breath. 4. Hold for __________ seconds. 5. Slowly return to the starting position and repeat with your right leg and your left arm. Repeat __________ times. Complete this exercise __________ times a day. Posture and body mechanics   Body mechanics refers to the movements and positions of your body while you do your daily activities. Posture is part of body mechanics. Good posture and healthy body mechanics can help to relieve stress in your body's tissues and joints. Good posture means that your spine is in its natural S-curve position (your spine is neutral), your shoulders are pulled back slightly, and your head is not tipped forward. The following are general guidelines for applying improved posture and body mechanics to your everyday activities. Standing   When standing, keep your spine neutral and your feet about hip-width apart. Keep a slight bend in your knees. Your ears, shoulders, and hips should line up.  When you do a task in which you stand in one place for a long time, place one foot up on a stable object that is 2-4 inches (5-10 cm) high, such as a footstool. This helps keep your spine neutral. Sitting  When sitting, keep your spine neutral and keep your feet flat on the floor. Use a footrest, if necessary, and keep your thighs parallel to the floor. Avoid rounding your shoulders, and avoid tilting your head forward.  When working at a desk or a computer, keep your desk at a height where your hands are slightly lower than your elbows. Slide your chair under your desk so you are close enough to maintain good posture.  When working at a computer, place your monitor at a height where you are looking straight ahead and you do not have to tilt your head forward or downward to look at the screen. Resting   When lying down and resting,  avoid positions that are most painful for you.  If you have pain with activities such as sitting, bending, stooping, or squatting (flexion-based activities), lie in a position in which your body does not bend very much. For example, avoid curling up on your side with your arms and knees near your chest (fetal position).  If you have pain with activities such as standing for a long time or reaching with your arms (extension-based activities), lie with your spine in a neutral position and bend your knees slightly. Try the following positions:  Lying on your side with a pillow between your knees.  Lying on your back with a pillow under your knees. Lifting   When lifting objects, keep your feet at least shoulder-width apart and tighten your abdominal muscles.  Bend your knees and hips and keep your spine neutral. It is important to lift using the strength of your legs, not your back. Do not lock  your knees straight out.  Always ask for help to lift heavy or awkward objects. This information is not intended to replace advice given to you by your health care provider. Make sure you discuss any questions you have with your health care provider. Document Released: 09/27/2005 Document Revised: 06/03/2016 Document Reviewed: 06/13/2015 Elsevier Interactive Patient Education  2017 [employer].    IF you received an x-ray today, you will receive an invoice from [facility]. Please contact [facility] at [phone] with questions or concerns regarding your invoice.   IF you received labwork today, you will receive an invoice from [employer]. Please contact [facility] at [phone] with questions or concerns regarding your invoice.   Our billing staff will not be able to assist you with questions regarding bills from these companies.  You will be contacted with the lab results as soon as they are available. The fastest way to get your results is to  activate your My Chart account. Instructions are located on the last page of this paperwork. If you have not heard from [place] regarding the results in 2 weeks, please contact this office.

## 2016-09-14 ENCOUNTER — Telehealth: Payer: Self-pay

## 2016-09-14 NOTE — Telephone Encounter (Signed)
Called number and spoke with pts son. Informed him that prescription went to the correct pharmacy.

## 2016-09-14 NOTE — Telephone Encounter (Signed)
Pt son is calling to update the information regarding which pharamcy she uses   [facility] at [address]    he states she was supposed to have the skelaxin sent to this pharmacy but was unusure of pharmacy at pt last visit   Please advice [phone]

## 2016-10-13 ENCOUNTER — Ambulatory Visit (INDEPENDENT_AMBULATORY_CARE_PROVIDER_SITE_OTHER): Payer: Self-pay | Admitting: Orthopaedic Surgery

## 2016-12-09 ENCOUNTER — Ambulatory Visit (INDEPENDENT_AMBULATORY_CARE_PROVIDER_SITE_OTHER): Payer: Medicare Other | Admitting: Family Medicine

## 2016-12-09 VITALS — BP 112/60 | HR 58 | Temp 97.5°F | Resp 18 | Ht 65.0 in | Wt 136.4 lb

## 2016-12-09 DIAGNOSIS — L84 Corns and callosities: Secondary | ICD-10-CM | POA: Diagnosis not present

## 2016-12-09 NOTE — Patient Instructions (Signed)
     IF you received an x-ray today, you will receive an invoice from [facility]. Please contact [facility] at [phone] with questions or concerns regarding your invoice.   IF you received labwork today, you will receive an invoice from [facility]. Please contact [facility] at [phone] with questions or concerns regarding your invoice.   Our billing staff will not be able to assist you with questions regarding bills from these companies.  You will be contacted with the lab results as soon as they are available. The fastest way to get your results is to activate your My Chart account. Instructions are located on the last page of this paperwork. If you have not heard from us regarding the results in 2 weeks, please contact this office.     

## 2016-12-09 NOTE — Progress Notes (Signed)
Subjective:    Patient ID: Rebecca Munoz, female    DOB: 18-Sep-1921, 83 y.o.   MRN: [id number]  12/09/2016  Callus on toe   HPI This 83 y.o. female presents for evaluation of callus on first toe that is becoming painful. No trauma; no bleeding. No blister.  [facility].  PCP is primary care provider.  No chronic health problems other than osteoporosis.  Presents for acute issues only.   Review of Systems  Constitutional: Negative for chills, diaphoresis, fatigue and fever.  HENT: Negative for ear pain, postnasal drip, rhinorrhea, sinus pressure, sore throat and trouble swallowing.   Respiratory: Negative for cough and shortness of breath.   Cardiovascular: Negative for chest pain, palpitations and leg swelling.  Gastrointestinal: Negative for abdominal pain, constipation, diarrhea, nausea and vomiting.  Skin: Positive for wound. Negative for color change, pallor and rash.    Past Medical History:  Diagnosis Date  . Cancer [facility])    breast  . Osteoporosis    Past Surgical History:  Procedure Laterality Date  . APPENDECTOMY    . BACK SURGERY    . BRAIN SURGERY Right    lumpectomy  . EYE SURGERY    . JOINT REPLACEMENT     No Known Allergies Current Outpatient Prescriptions  Medication Sig Dispense Refill  . acetaminophen (TYLENOL) 325 MG tablet Take 1 tablet (325 mg total) by mouth every 6 (six) hours as needed. 90 tablet 1  . aspirin 81 MG tablet Take 81 mg by mouth daily.    . Multiple Vitamins-Minerals (MULTIVITAMIN WITH MINERALS) tablet Take 1 tablet by mouth daily.     No current facility-administered medications for this visit.    Social History   Social History  . Marital status: Widowed    Spouse name: N/A  . Number of children: N/A  . Years of education: N/A   Occupational History  . Not on file.   Social History Main Topics  . Smoking status: Never Smoker  . Smokeless tobacco: Never Used  . Alcohol use No  . Drug use: No    . Sexual activity: Not on file   Other Topics Concern  . Not on file   Social History Narrative  . No narrative on file   History reviewed. No pertinent family history.     Objective:    BP 112/60   Pulse (!) 58   Temp 97.5 F (36.4 C) (Oral)   Resp 18   Ht 5\' 5"  (1.651 m)   Wt 136 lb 6.4 oz (61.9 kg)   SpO2 97%   BMI 22.70 kg/m  Physical Exam  Constitutional: She is oriented to person, place, and time. She appears well-developed and well-nourished. No distress.  HENT:  Head: Normocephalic and atraumatic.  Eyes: Conjunctivae are normal. Pupils are equal, round, and reactive to light.  Neck: Normal range of motion. Neck supple.  Cardiovascular: Normal rate, regular rhythm and normal heart sounds.  Exam reveals no gallop and no friction rub.   No murmur heard. Pulmonary/Chest: Effort normal and breath sounds normal. She has no wheezes. She has no rales.  Musculoskeletal:       Feet:  Thickened hypertrophied skin.  Neurological: She is alert and oriented to person, place, and time.  Skin: She is not diaphoretic.  Psychiatric: She has a normal mood and affect. Her behavior is normal.  Nursing note and vitals reviewed.  PROCEDURE NOTE: VERBAL CONSENT OBTAINED; TEN BLADE PAIRED DOWN CALLUS; PT TOLERATED WELL.  Assessment & Plan:   1. Callus of foot    -New; s/p pairing in office.   No orders of the defined types were placed in this encounter.  No orders of the defined types were placed in this encounter.   No Follow-up on file.    Alyaan Budzynski Elayne Guerin, M.D. Primary Care at [facility] [address] [place], [place]  [address] [phone] phone [phone] fax

## 2017-01-19 DIAGNOSIS — H35421 Microcystoid degeneration of retina, right eye: Secondary | ICD-10-CM | POA: Diagnosis not present

## 2017-01-19 DIAGNOSIS — H35372 Puckering of macula, left eye: Secondary | ICD-10-CM | POA: Diagnosis not present

## 2017-01-19 DIAGNOSIS — H353222 Exudative age-related macular degeneration, left eye, with inactive choroidal neovascularization: Secondary | ICD-10-CM | POA: Diagnosis not present

## 2017-01-19 DIAGNOSIS — H353213 Exudative age-related macular degeneration, right eye, with inactive scar: Secondary | ICD-10-CM | POA: Diagnosis not present

## 2017-02-18 ENCOUNTER — Ambulatory Visit (INDEPENDENT_AMBULATORY_CARE_PROVIDER_SITE_OTHER): Payer: Medicare Other | Admitting: Physician Assistant

## 2017-02-18 ENCOUNTER — Encounter: Payer: Self-pay | Admitting: Physician Assistant

## 2017-02-18 VITALS — BP 139/65 | HR 52 | Temp 97.8°F | Resp 17 | Ht 65.0 in | Wt 136.0 lb

## 2017-02-18 DIAGNOSIS — J3489 Other specified disorders of nose and nasal sinuses: Secondary | ICD-10-CM

## 2017-02-18 DIAGNOSIS — K219 Gastro-esophageal reflux disease without esophagitis: Secondary | ICD-10-CM | POA: Diagnosis not present

## 2017-02-18 DIAGNOSIS — R1013 Epigastric pain: Secondary | ICD-10-CM

## 2017-02-18 MED ORDER — RANITIDINE HCL 150 MG PO TABS: 150.0000 mg | ORAL_TABLET | Freq: Two times a day (BID) | ORAL | 1 refills | Status: DC

## 2017-02-18 NOTE — Patient Instructions (Addendum)
For the lesion on your nose, I have place a referral to dermatology. You should hear from them in the next 1-2 weeks. If you do not, please contact our office.  For your gas and heartburn, we should have your results in a few days and will contact you within the next week with those. For the meantime, I have given you some information below about GERD and would like you to read up on this. You should avoid all the foods on this list as dietary modifications really help with symptoms. I have also given you a prescription for a medication that I want you to take twice a day for the next two weeks. . I would like you to follow up in office in 2 weeks for reevaluation. If any of your symptoms worsen or you develop new concerning symptoms, please seek care immediately.    Food Choices for Gastroesophageal Reflux Disease, Adult When you have gastroesophageal reflux disease (GERD), the foods you eat and your eating habits are very important. Choosing the right foods can help ease your discomfort. What guidelines do I need to follow?  Choose fruits, vegetables, whole grains, and low-fat dairy products.  Choose low-fat meat, fish, and poultry.  Limit fats such as oils, salad dressings, butter, nuts, and avocado.  Keep a food diary. This helps you identify foods that cause symptoms.  Avoid foods that cause symptoms. These may be different for everyone.  Eat small meals often instead of 3 large meals a day.  Eat your meals slowly, in a place where you are relaxed.  Limit fried foods.  Cook foods using methods other than frying.  Avoid drinking alcohol.  Avoid drinking large amounts of liquids with your meals.  Avoid bending over or lying down until 2-3 hours after eating. What foods are not recommended? These are some foods and drinks that may make your symptoms worse: Vegetables  Tomatoes. Tomato juice. Tomato and spaghetti sauce. Chili peppers. Onion and garlic. Horseradish. Fruits   Oranges, grapefruit, and lemon (fruit and juice). Meats  High-fat meats, fish, and poultry. This includes hot dogs, ribs, ham, sausage, salami, and bacon. Dairy  Whole milk and chocolate milk. Sour cream. Cream. Butter. Ice cream. Cream cheese. Drinks  Coffee and tea. Bubbly (carbonated) drinks or energy drinks. Condiments  Hot sauce. Barbecue sauce. Sweets/Desserts  Chocolate and cocoa. Donuts. Peppermint and spearmint. Fats and Oils  High-fat foods. This includes [place] fries and potato chips. Other  Vinegar. Strong spices. This includes black pepper, white pepper, red pepper, cayenne, curry powder, cloves, ginger, and chili powder. The items listed above may not be a complete list of foods and drinks to avoid. Contact your dietitian for more information.  This information is not intended to replace advice given to you by your health care provider. Make sure you discuss any questions you have with your health care provider. Document Released: 03/28/2012 Document Revised: 03/04/2016 Document Reviewed: 08/01/2013 Elsevier Interactive Patient Education  2017 Else[employer] Inc.    Heartburn Heartburn is a type of pain or discomfort that can happen in the throat or chest. It is often described as a burning pain. It may also cause a bad taste in the mouth. Heartburn may feel worse when you lie down or bend over. It may be caused by stomach contents that move back up (reflux) into the tube that connects the mouth with the stomach (esophagus). Follow these instructions at home: Take these actions to lessen your discomfort and to help avoid problems.  Diet   Follow a diet as told by your doctor. You may need to avoid foods and drinks such as:  Coffee and tea (with or without caffeine).  Drinks that contain alcohol.  Energy drinks and sports drinks.  Carbonated drinks or sodas.  Chocolate and cocoa.  Peppermint and mint flavorings.  Garlic and onions.  Horseradish.  Spicy and  acidic foods, such as peppers, chili powder, curry powder, vinegar, hot sauces, and BBQ sauce.  Citrus fruit juices and citrus fruits, such as oranges, lemons, and limes.  Tomato-based foods, such as red sauce, chili, salsa, and pizza with red sauce.  Fried and fatty foods, such as donuts, french fries, potato chips, and high-fat dressings.  High-fat meats, such as hot dogs, rib eye steak, sausage, ham, and bacon.  High-fat dairy items, such as whole milk, butter, and cream cheese.  Eat small meals often. Avoid eating large meals.  Avoid drinking large amounts of liquid with your meals.  Avoid eating meals during the 2-3 hours before bedtime.  Avoid lying down right after you eat.  Do not exercise right after you eat. General instructions   Pay attention to any changes in your symptoms.  Take over-the-counter and prescription medicines only as told by your doctor. Do not take aspirin, ibuprofen, or other NSAIDs unless your doctor says it is okay.  Do not use any tobacco products, including cigarettes, chewing tobacco, and e-cigarettes. If you need help quitting, ask your doctor.  Wear loose clothes. Do not wear anything tight around your waist.  Raise (elevate) the head of your bed about 6 inches (15 cm).  Try to lower your stress. If you need help doing this, ask your doctor.  If you are overweight, lose an amount of weight that is healthy for you. Ask your doctor about a safe weight loss goal.  Keep all follow-up visits as told by your doctor. This is important. Contact a doctor if:  You have new symptoms.  You lose weight and you do not know why it is happening.  You have trouble swallowing, or it hurts to swallow.  You have wheezing or a cough that keeps happening.  Your symptoms do not get better with treatment.  You have heartburn often for more than two weeks. Get help right away if:  You have pain in your arms, neck, jaw, teeth, or back.  You feel  sweaty, dizzy, or light-headed.  You have chest pain or shortness of breath.  You throw up (vomit) and your throw up looks like blood or coffee grounds.  Your poop (stool) is bloody or black. This information is not intended to replace advice given to you by your health care provider. Make sure you discuss any questions you have with your health care provider. Document Released: 06/09/2011 Document Revised: 03/04/2016 Document Reviewed: 01/22/2015 Elsevier Interactive Patient Education  2017 [employer].   IF you received an x-ray today, you will receive an invoice from [facility]. Please contact [facility] at [phone] with questions or concerns regarding your invoice.   IF you received labwork today, you will receive an invoice from [facility]. Please contact [facility] at [phone] with questions or concerns regarding your invoice.   Our billing staff will not be able to assist you with questions regarding bills from these companies.  You will be contacted with the lab results as soon as they are available. The fastest way to get your results is to activate your My Chart account. Instructions are located on  the last page of this paperwork. If you have not heard from [place] regarding the results in 2 weeks, please contact this office.

## 2017-02-18 NOTE — Progress Notes (Signed)
Korina Tretter  MRN: [id number] DOB: 1921-06-07  Subjective:  Rebecca Munoz is a 83 y.o. female seen in office today for a chief complaint of nose lesion x 6 months. It is a scaly dry lesion on left side of nose. Has not changed since it appeared. Has associated pruriits and mild discomfort. Denies drainage. She does wear glasses but it does not irritate this lesion. Has tried lotions with no relief. No new exposures to lotions or face wash. No hx of skin cancer. Does admit to a fair amount of sun exposure throughout her lifetime.  Pt would also like to discuss intermittent epigastric burning and belching x > 5 years. It occurs about every other day. Has associated acid reflux. It is aggravated by certain foods like liver, onions, and cucumbers. Has tried gas x with no full relief. Denies nausea, vomiting, hematemesis, melena, hematochezia, anorexia, unexplained weight loss, dysphagia, odynophagia, chest pain, SOB, and diaphoresis. Denies excessive NSAID use. Denies smoking. No hx of heart disease or gastric cancer.     Review of Systems  Constitutional: Negative for appetite change, chills, fatigue and fever.  Cardiovascular: Negative for palpitations.  Neurological: Negative for dizziness and light-headedness.    There are no active problems to display for this patient.   Current Outpatient Prescriptions on File Prior to Visit  Medication Sig Dispense Refill  . acetaminophen (TYLENOL) 325 MG tablet Take 1 tablet (325 mg total) by mouth every 6 (six) hours as needed. 90 tablet 1  . aspirin 81 MG tablet Take 81 mg by mouth daily.    . Multiple Vitamins-Minerals (MULTIVITAMIN WITH MINERALS) tablet Take 1 tablet by mouth daily.     No current facility-administered medications on file prior to visit.     No Known Allergies    Social History   Social History  . Marital status: Widowed    Spouse name: N/A  . Number of children: N/A  . Years of education: N/A   Occupational History   . Not on file.   Social History Main Topics  . Smoking status: Never Smoker  . Smokeless tobacco: Never Used  . Alcohol use No  . Drug use: No  . Sexual activity: Not on file   Other Topics Concern  . Not on file   Social History Narrative  . No narrative on file    Objective:  BP 139/65 (BP Location: Right Arm, Patient Position: Sitting, Cuff Size: Normal)   Pulse (!) 52   Temp 97.8 F (36.6 C) (Oral)   Resp 17   Ht 5' 5"  (1.651 m)   Wt 136 lb (61.7 kg)   SpO2 94%   BMI 22.63 kg/m   Physical Exam  Constitutional: She is oriented to person, place, and time and well-developed, well-nourished, and in no distress.  HENT:  Head: Normocephalic and atraumatic.  Nose:    Eyes: Conjunctivae are normal.  Neck: Normal range of motion.  Cardiovascular: Normal rate, regular rhythm and normal heart sounds.   Pulmonary/Chest: Effort normal.  Abdominal: Soft. Normal appearance and bowel sounds are normal. There is no tenderness. There is no rigidity, no guarding, no tenderness at McBurney's point and negative Murphy's sign.  Neurological: She is alert and oriented to person, place, and time. Gait normal.  Skin: Skin is warm and dry.  Psychiatric: Affect normal.  Vitals reviewed.     Assessment and Plan :   1. Dyspepsia - CBC with Differential/Platelet - H. pylori breath test - CMP14+EGFR - Care order/instruction:  2. Epigastric burning sensation 3. Gastroesophageal reflux disease, esophagitis presence not specified Asymptomatic today.Labs pending. Hx consistent with good as pt can identify food triggers. I have given her educational material on foods to avoid with GERD.Pt instructed to return in 2 weeks for reevaluation. Consider PPI initiation if no full improvement on zantac. Given strict return/ED precuations - ranitidine (ZANTAC) 150 MG tablet; Take 1 tablet (150 mg total) by mouth 2 (two) times daily.  Dispense: 30 tablet; Refill: 1  4. Lesion of nose Due to  location and appearance of lesion, pt warrants further evaluation from dermatology.  - Ambulatory referral to Dermatology   Tenna Delaine PA-C  [facility] 02/18/2017 10:20 AM

## 2017-02-19 LAB — CBC WITH DIFFERENTIAL/PLATELET
BASOS ABS: 0 10*3/uL (ref 0.0–0.2)
BASOS: 1 %
EOS (ABSOLUTE): 0.3 10*3/uL (ref 0.0–0.4)
Eos: 5 %
Hematocrit: 39.4 % (ref 34.0–46.6)
Hemoglobin: 13.2 g/dL (ref 11.1–15.9)
IMMATURE GRANS (ABS): 0 10*3/uL (ref 0.0–0.1)
IMMATURE GRANULOCYTES: 0 %
LYMPHS: 35 %
Lymphocytes Absolute: 1.9 10*3/uL (ref 0.7–3.1)
MCH: 30 pg (ref 26.6–33.0)
MCHC: 33.5 g/dL (ref 31.5–35.7)
MCV: 90 fL (ref 79–97)
Monocytes Absolute: 0.5 10*3/uL (ref 0.1–0.9)
Monocytes: 9 %
NEUTROS PCT: 50 %
Neutrophils Absolute: 2.8 10*3/uL (ref 1.4–7.0)
PLATELETS: 205 10*3/uL (ref 150–379)
RBC: 4.4 x10E6/uL (ref 3.77–5.28)
RDW: 15 % (ref 12.3–15.4)
WBC: 5.6 10*3/uL (ref 3.4–10.8)

## 2017-02-19 LAB — CMP14+EGFR
ALT: 25 IU/L (ref 0–32)
AST: 21 IU/L (ref 0–40)
Albumin/Globulin Ratio: 1.8 (ref 1.2–2.2)
Albumin: 4.3 g/dL (ref 3.2–4.6)
Alkaline Phosphatase: 103 IU/L (ref 39–117)
BUN/Creatinine Ratio: 23 (ref 12–28)
BUN: 18 mg/dL (ref 10–36)
Bilirubin Total: 0.5 mg/dL (ref 0.0–1.2)
CALCIUM: 10.7 mg/dL — AB (ref 8.7–10.3)
CO2: 27 mmol/L (ref 18–29)
Chloride: 99 mmol/L (ref 96–106)
Creatinine, Ser: 0.8 mg/dL (ref 0.57–1.00)
GFR, EST AFRICAN AMERICAN: 73 mL/min/{1.73_m2} (ref >59)
GFR, EST NON AFRICAN AMERICAN: 64 mL/min/{1.73_m2} (ref >59)
GLUCOSE: 90 mg/dL (ref 65–99)
Globulin, Total: 2.4 g/dL (ref 1.5–4.5)
Potassium: 4.6 mmol/L (ref 3.5–5.2)
Sodium: 141 mmol/L (ref 134–144)
TOTAL PROTEIN: 6.7 g/dL (ref 6.0–8.5)

## 2017-02-19 LAB — H. PYLORI BREATH TEST: H. pylori UBiT: NEGATIVE

## 2017-03-01 ENCOUNTER — Telehealth: Payer: Self-pay | Admitting: Physician Assistant

## 2017-03-01 NOTE — Telephone Encounter (Signed)
Pt left vm for referrals concerning her appt at our office tomorrow. Pt said she was not sure what she was supposed to bring to appt. I was unsure if there was anything she was instructed to bring. Pt callback number is [phone].

## 2017-03-01 NOTE — Telephone Encounter (Signed)
Any thing she needs? Fast for labs?

## 2017-03-01 NOTE — Telephone Encounter (Signed)
She does not have to bring anything to the appointment, it is just a follow up so she does not have to fast either. Thanks! Will you please call and let her know.

## 2017-03-02 ENCOUNTER — Encounter: Payer: Self-pay | Admitting: Physician Assistant

## 2017-03-02 ENCOUNTER — Ambulatory Visit (INDEPENDENT_AMBULATORY_CARE_PROVIDER_SITE_OTHER): Payer: Medicare Other | Admitting: Physician Assistant

## 2017-03-02 VITALS — BP 135/75 | HR 72 | Temp 98.2°F | Resp 16 | Ht 66.0 in | Wt 133.2 lb

## 2017-03-02 DIAGNOSIS — R1013 Epigastric pain: Secondary | ICD-10-CM | POA: Diagnosis not present

## 2017-03-02 DIAGNOSIS — Z13220 Encounter for screening for lipoid disorders: Secondary | ICD-10-CM | POA: Diagnosis not present

## 2017-03-02 DIAGNOSIS — E785 Hyperlipidemia, unspecified: Secondary | ICD-10-CM | POA: Diagnosis not present

## 2017-03-02 MED ORDER — SUCRALFATE 1 G PO TABS: 1.0000 g | ORAL_TABLET | Freq: Every day | ORAL | 0 refills | Status: DC

## 2017-03-02 MED ORDER — OMEPRAZOLE 20 MG PO CPDR: 20.0000 mg | DELAYED_RELEASE_CAPSULE | Freq: Every day | ORAL | 0 refills | Status: DC

## 2017-03-02 NOTE — Progress Notes (Signed)
MRN: [id number] DOB: December 24, 1920  Subjective:   Rebecca Munoz is a 83 y.o. female presenting for follow up on epigastric pain. Pt initially seen by me 02/18/17 for epigastric pain and belching x >5 years. Could identify foods that were associated with it like liver, onions, and cucumbers. Also, noted that stress seemed to make it worse. Had tried gas x with no relief. CBC, CMP, and H.pylori test from that visit all normal. Pt was started on zantac 150mg  BID and told to follow up in 2 weeks. Pt notes in the past two weeks the pain has slightly improved however it is hard to tell because she has been really stressed due to the passing of her sister-in-law. She has been taking the zantac as prescribed and has stopped consuming aggravating foods as often, however, she still admits to eating some of them that are on the "list to avoid." Notes the pain is worse when she is hungry. Pt notes she will occasionally have problems with constipation but typically this will resolve on it's own. Denies radiating symptoms, nausea, vomiting, hematemesis, melena, hematochezia, diarrhea, anorexia, unexplained weight loss, dysphagia, odynophagia, chest pain, SOB, and diaphoresis. Denies excessive NSAID use. Denies smoking. No hx of heart disease or gastric cancer.    Rebecca Munoz has a current medication list which includes the following prescription(s): acetaminophen, aspirin, multivitamin with minerals, ranitidine, omeprazole, and sucralfate. Also has No Known Allergies.  Rebecca Munoz  has a past medical history of Cancer ([place]) and Osteoporosis. Also  has a past surgical history that includes Eye surgery; Back surgery; Appendectomy; Joint replacement; and Breast surgery (Right).   Objective:   Vitals: BP 135/75 (BP Location: Left Arm, Patient Position: Sitting, Cuff Size: Large)   Pulse 72   Temp 98.2 F (36.8 C) (Oral)   Resp 16   Ht 5\' 6"  (1.676 m)   Wt 133 lb 3.2 oz (60.4 kg)   SpO2 95%   BMI 21.50 kg/m    Physical Exam  Constitutional: She is oriented to person, place, and time. She appears well-developed and well-nourished. No distress.  HENT:  Head: Normocephalic and atraumatic.  Eyes: Conjunctivae are normal.  Neck: Normal range of motion.  Cardiovascular: Normal rate, regular rhythm and normal heart sounds.   Pulmonary/Chest: Effort normal and breath sounds normal. She has no wheezes. She has no rhonchi. She has no rales.  Abdominal: Soft. Normal appearance and bowel sounds are normal. There is no tenderness.  Neurological: She is alert and oriented to person, place, and time.  Skin: Skin is warm and dry.  Psychiatric: She has a normal mood and affect.  Vitals reviewed.  No results found for this or any previous visit (from the past 24 hour(s)).   EKG shows sinus rhythm with rate of 60. PR and QRS intervals within normal limits. No acute ST or T wave changes. No prior EKG for comparison.    Assessment and Plan :  1. Abdominal pain, epigastric There is only slight improvement from initial visit. Hx suspicious for PUD. Will discontinue zantac at this time and initiate a PPI daily. Will also start carafate for at least 4 weeks while PPI is taking full effect. Pt educated on side effect of constipation with carafate. Encouraged to use a stool softener if this becomes an issue, given educational material on how to do so in AVS. Labs pending. EKG is stable. Will plan for follow up in 4-6 weeks. If no improvement at that time, consider referral to GI.  -  Lipid panel - Lipase - EKG 12-Lead - sucralfate (CARAFATE) 1 g tablet; Take 1 tablet (1 g total) by mouth daily with breakfast.  Dispense: 60 tablet; Refill: 0 - omeprazole (PRILOSEC) 20 MG capsule; Take 1 capsule (20 mg total) by mouth daily.  Dispense: 60 capsule; Refill: 0  2. Hyperlipidemia, unspecified hyperlipidemia type Pt is fasting today.  - Lipid panel  Tenna Delaine, PA-C  [facility] 03/02/2017 7:50 PM

## 2017-03-02 NOTE — Patient Instructions (Addendum)
Your EKG looks great today. This is likely due to an ulcer. Stop taking the zantac. Start taking carafate tablet in the morning with food and omeprazole at night with a meal. This should provide relief if you have an ulcer. Continue to avoid the foods on the list I gave you at the prior visit. I would like you to follow up in 4-6 weeks and we will reevaluate your pain. If you are no better at this time, you may need to see a GI doctor.   To help reduce constipation and promote bowel health: 1. Drink at least 64 ounces of water each day 2. Eat plenty of fiber (fruits, vegetables, whole grains, legumes) 3. Be physically active or exercise including walking, jogging, swimming, yoga, etc. 4. For active constipation use a stool softener (docusate) daily for at least a week, you can buy these over the counter. If stools remain loose, cut back to 1/2 pill for ~1 week. You can stop docusate thereafter and resume as needed for constipation.    Peptic Ulcer A peptic ulcer is a painful sore in the lining of your esophagus, stomach, or the first part of your small intestine. You may have pain in the area between your chest and your belly button. The most common causes of an ulcer are:  An infection.  Using certain pain medicines too often or too much. Follow these instructions at home:  Avoid alcohol.  Avoid caffeine.  Do not use any tobacco products. These include cigarettes, chewing tobacco, and e-cigarettes. If you need help quitting, ask your doctor.  Take over-the-counter and prescription medicines only as told by your doctor. Do not stop or change your medicines unless you talk with your doctor about it first.  Keep all follow-up visits as told by your doctor. This is important. Contact a doctor if:  You do not get better in 7 days after you start treatment.  You keep having an upset stomach (indigestion) or heartburn. Get help right away if:  You have sudden, sharp pain in your belly  (abdomen).  You have lasting belly pain.  You have bloody poop (stool) or black, tarry poop.  You throw up (vomit) blood. It may look like coffee grounds.  You feel light-headed or feel like you may pass out (faint).  You get weak.  You get sweaty or feel sticky and cold to the touch (clammy). This information is not intended to replace advice given to you by your health care provider. Make sure you discuss any questions you have with your health care provider. Document Released: 12/22/2009 Document Revised: 02/11/2016 Document Reviewed: 06/28/2015 Elsevier Interactive Patient Education  2017 [employer].    IF you received an x-ray today, you will receive an invoice from [facility]. Please contact [facility] at [phone] with questions or concerns regarding your invoice.   IF you received labwork today, you will receive an invoice from [place]. Please contact [facility] at [phone] with questions or concerns regarding your invoice.   Our billing staff will not be able to assist you with questions regarding bills from these companies.  You will be contacted with the lab results as soon as they are available. The fastest way to get your results is to activate your My Chart account. Instructions are located on the last page of this paperwork. If you have not heard from [place] regarding the results in 2 weeks, please contact this office.

## 2017-03-03 LAB — LIPID PANEL
Chol/HDL Ratio: 4.2 ratio (ref 0.0–4.4)
Cholesterol, Total: 260 mg/dL — ABNORMAL HIGH (ref 100–199)
HDL: 62 mg/dL (ref >39)
LDL CALC: 180 mg/dL — AB (ref 0–99)
Triglycerides: 90 mg/dL (ref 0–149)
VLDL CHOLESTEROL CAL: 18 mg/dL (ref 5–40)

## 2017-03-03 LAB — LIPASE: LIPASE: 23 U/L (ref 14–85)

## 2017-03-09 ENCOUNTER — Ambulatory Visit: Payer: Self-pay | Admitting: Physician Assistant

## 2017-03-16 DIAGNOSIS — L821 Other seborrheic keratosis: Secondary | ICD-10-CM | POA: Diagnosis not present

## 2017-03-16 DIAGNOSIS — L814 Other melanin hyperpigmentation: Secondary | ICD-10-CM | POA: Diagnosis not present

## 2017-04-20 ENCOUNTER — Ambulatory Visit: Payer: Self-pay | Admitting: Physician Assistant

## 2017-04-20 DIAGNOSIS — H353213 Exudative age-related macular degeneration, right eye, with inactive scar: Secondary | ICD-10-CM | POA: Diagnosis not present

## 2017-04-20 DIAGNOSIS — H353222 Exudative age-related macular degeneration, left eye, with inactive choroidal neovascularization: Secondary | ICD-10-CM | POA: Diagnosis not present

## 2017-04-27 ENCOUNTER — Encounter: Payer: Self-pay | Admitting: Physician Assistant

## 2017-04-27 ENCOUNTER — Ambulatory Visit (INDEPENDENT_AMBULATORY_CARE_PROVIDER_SITE_OTHER): Payer: Medicare Other | Admitting: Physician Assistant

## 2017-04-27 DIAGNOSIS — R1013 Epigastric pain: Secondary | ICD-10-CM

## 2017-04-27 MED ORDER — OMEPRAZOLE 20 MG PO CPDR: 20.0000 mg | DELAYED_RELEASE_CAPSULE | Freq: Every day | ORAL | 0 refills | Status: DC | PRN

## 2017-04-27 NOTE — Patient Instructions (Addendum)
For the next week take omeprazole 20mg  every other day. After this you can use it as needed. For instance, if you know you are going to have oranges or tomatoes one day, take one. I have sent in an additional refill to your pharmacy. If your symptoms return or they continue to persist over the next 1-2 months, please call our office as you may warrant a GI referral at that time. Follow up with me as needed. Thank you for letting me participate in your health and well being.    IF you received an x-ray today, you will receive an invoice from [facility]. Please contact [facility] at [phone] with questions or concerns regarding your invoice.   IF you received labwork today, you will receive an invoice from [place]. Please contact [facility] at [phone] with questions or concerns regarding your invoice.   Our billing staff will not be able to assist you with questions regarding bills from these companies.  You will be contacted with the lab results as soon as they are available. The fastest way to get your results is to activate your My Chart account. Instructions are located on the last page of this paperwork. If you have not heard from [place] regarding the results in 2 weeks, please contact this office.

## 2017-04-27 NOTE — Progress Notes (Signed)
    MRN: [id number] DOB: 03-29-1921  Subjective:   Rebecca Munoz is a 83 y.o. female presenting for follow up on epigastric pain. Pt last seen by me in office on 03/02/17 with hx suspicious for PUD. Zantac was d/c at this visit and Prilosec and Carafate were initiated. Lipase, CMP, H.pylori all normal. Total cholesterol elevated at 260. Pt instructed to take medication as prescribed, limit aggravating foods in her diet, and return in 4-6 weeks for reevaluation. Today, she admits to good compliance with both medication and diet changes. She notes her pain has improved to about 50-60% better. She will still occasionally have the epigastric pain at bedtime. Denies radiating symptoms, nausea, vomiting, hematemesis, melena, hematochezia, diarrhea, anorexia, unexplained weight loss, dysphagia, odynophagia, chest pain, SOB, and diaphoresis. Denies excessive NSAID use. Denies smoking. No hx of heart disease or gastric cancer. She does miss eating both oranges and tomatoes.   Rebecca Munoz has a current medication list which includes the following prescription(s): acetaminophen, aspirin, multivitamin with minerals, omeprazole, and sucralfate. Also has No Known Allergies.  Rebecca Munoz  has a past medical history of Cancer ([place]) and Osteoporosis. Also  has a past surgical history that includes Eye surgery; Back surgery; Appendectomy; Joint replacement; and Breast surgery (Right).   Objective:   Vitals: There were no vitals taken for this visit.  Physical Exam  Constitutional: She is oriented to person, place, and time. She appears well-developed and well-nourished.  HENT:  Head: Normocephalic and atraumatic.  Eyes: Conjunctivae are normal.  Neck: Normal range of motion.  Pulmonary/Chest: Effort normal.  Abdominal: Soft. Normal appearance and bowel sounds are normal. She exhibits no distension. There is no tenderness. There is no rigidity, no guarding, no tenderness at McBurney's point and negative Murphy's sign.    Neurological: She is alert and oriented to person, place, and time.  Skin: Skin is warm and dry.  Psychiatric: She has a normal mood and affect.  Vitals reviewed.   No results found for this or any previous visit (from the past 24 hour(s)).  Assessment and Plan :  1. Abdominal pain, epigastric Improved. Pt encouraged to continue dietary modifications. Will taper off prilosec over the next week then instructed to use prn for discomfort. If pain returns or does not completely resolve over the next 1-2 months, pt instructed to contact our office or return as she may warrant a GI referral at that time.  - omeprazole (PRILOSEC) 20 MG capsule; Take 1 capsule (20 mg total) by mouth daily as needed.  Dispense: 30 capsule; Refill: 0  Tenna Delaine, PA-C  [facility] [place] [facility] 04/27/2017 2:15 PM

## 2017-05-03 ENCOUNTER — Telehealth: Payer: Self-pay | Admitting: Physician Assistant

## 2017-05-03 NOTE — Telephone Encounter (Signed)
Pt's son called requesting referral to ENT for pt. He said the pt is having issues with her throat and is losing her voice and is afraid she will lose it for good. He said she has been having some upper GI issues/ reflux and thinks it may be related to this. Please advise. Thanks!

## 2017-05-04 ENCOUNTER — Encounter: Payer: Self-pay | Admitting: Physician Assistant

## 2017-05-04 ENCOUNTER — Ambulatory Visit (INDEPENDENT_AMBULATORY_CARE_PROVIDER_SITE_OTHER): Payer: Medicare Other | Admitting: Physician Assistant

## 2017-05-04 ENCOUNTER — Ambulatory Visit (INDEPENDENT_AMBULATORY_CARE_PROVIDER_SITE_OTHER): Payer: Medicare Other

## 2017-05-04 VITALS — BP 107/65 | HR 59 | Temp 98.1°F | Resp 16

## 2017-05-04 DIAGNOSIS — S93402A Sprain of unspecified ligament of left ankle, initial encounter: Secondary | ICD-10-CM

## 2017-05-04 DIAGNOSIS — T161XXA Foreign body in right ear, initial encounter: Secondary | ICD-10-CM

## 2017-05-04 DIAGNOSIS — J309 Allergic rhinitis, unspecified: Secondary | ICD-10-CM | POA: Diagnosis not present

## 2017-05-04 DIAGNOSIS — S92355A Nondisplaced fracture of fifth metatarsal bone, left foot, initial encounter for closed fracture: Secondary | ICD-10-CM

## 2017-05-04 DIAGNOSIS — S92352A Displaced fracture of fifth metatarsal bone, left foot, initial encounter for closed fracture: Secondary | ICD-10-CM | POA: Diagnosis not present

## 2017-05-04 DIAGNOSIS — H6123 Impacted cerumen, bilateral: Secondary | ICD-10-CM

## 2017-05-04 MED ORDER — LORATADINE 10 MG PO TABS: 10.0000 mg | ORAL_TABLET | Freq: Every day | ORAL | 0 refills | Status: DC

## 2017-05-04 MED ORDER — ACETAMINOPHEN 500 MG PO TABS: 500.0000 mg | ORAL_TABLET | Freq: Four times a day (QID) | ORAL | 0 refills | Status: DC | PRN

## 2017-05-04 NOTE — Progress Notes (Deleted)
Subjective:    Rebecca Munoz is a 83 y.o. female who presents with {R/L:30031} ankle pain. Onset of the symptoms was {onset:14048}. Inciting event: {ankle inciting events:14096}. Current symptoms include: {symptoms; ankle pain:14097}. Aggravating factors: {causes; ankle pain:19556}. Symptoms have {desc;sx progression:19445}. Patient {has had:32492} prior ankle problems. Evaluation to date: {eval:14090}. Treatment to date: {tx to date:14089}. {Common ambulatory SmartLinks:19316}    Objective:    BP 107/65 (BP Location: Left Arm, Patient Position: Sitting, Cuff Size: Normal)   Pulse (!) 59   Temp 98.1 F (36.7 C) (Oral)   Resp 16   SpO2 94%  Right ankle:   {exam; ankle:14100}  Left ankle:   {exam; ankle:14100}   Imaging: X-ray of {R/L:19164} ankle(s): {normal:5769::"no fracture, dislocation, swelling or degenerative changes noted"}    Assessment:    {ankle pain dx list:14101}    Plan:    {ankle tx plan:14102}

## 2017-05-04 NOTE — Progress Notes (Signed)
Rebecca Munoz  MRN: [id number] DOB: 05/08/1921  Subjective:    Rebecca Munoz is a 83 y.o. female who presents with left ankle pain. Onset of the symptoms was yesterday. Inciting event: inverted while standing up from seated position. Current symptoms include: ability to bear weight, but with some pain, bruising, pain with inversion of the foot, swelling and worsening symptoms after a period of activity. Aggravating factors: direct pressure and weight bearing. Patient has had no prior ankle problems. Evaluation to date: none. Treatment to date: tylenol which has been somewhat effective.  Pt also would like to discuss seasonal allergies. States she always has sneezing, itchy watery eyes, and rhinorrhea but does not know what to take. Has also noticed that it is irritating her voice over they past week, notes it feels itchy and hoarse. She tries a "little pink pill" occasionally but it makes her drowsy. Denies any change in environment. Denies sore throat, ear pain, difficulty swallowing, and SOB.   Review of Systems  Constitutional: Negative for chills, diaphoresis and fever.    There are no active problems to display for this patient.   Current Outpatient Prescriptions on File Prior to Visit  Medication Sig Dispense Refill  . acetaminophen (TYLENOL) 325 MG tablet Take 1 tablet (325 mg total) by mouth every 6 (six) hours as needed. 90 tablet 1  . aspirin 81 MG tablet Take 81 mg by mouth daily.    . Multiple Vitamins-Minerals (MULTIVITAMIN WITH MINERALS) tablet Take 1 tablet by mouth daily.    Marland Kitchen omeprazole (PRILOSEC) 20 MG capsule Take 1 capsule (20 mg total) by mouth daily as needed. 30 capsule 0  . sucralfate (CARAFATE) 1 g tablet Take 1 tablet (1 g total) by mouth daily with breakfast. 60 tablet 0   No current facility-administered medications on file prior to visit.     No Known Allergies   Objective:  BP 107/65 (BP Location: Left Arm, Patient Position: Sitting, Cuff Size: Normal)    Pulse (!) 59   Temp 98.1 F (36.7 C) (Oral)   Resp 16   SpO2 94%   Physical Exam  Constitutional: She is oriented to person, place, and time and well-developed, well-nourished, and in no distress.  HENT:  Head: Normocephalic and atraumatic.  Right Ear: There is drainage (cerumen impaction blocking view of TM). A foreign body (surrouned with dark yellow cerumen) is present.  Left Ear: There is drainage ( cerumen impaction blocking view of TM).  Nose: Mucosal edema (moderate bilaterally) present.  Mouth/Throat: Uvula is midline, oropharynx is clear and moist and mucous membranes are normal.  Bilateral hearing aids in place.   Eyes: Conjunctivae are normal.  Neck: Normal range of motion.  Cardiovascular:  Pulses:      Dorsalis pedis pulses are 2+ on the right side, and 2+ on the left side.       Posterior tibial pulses are 2+ on the right side, and 2+ on the left side.  Pulmonary/Chest: Effort normal.  Musculoskeletal:       Right ankle: Normal.       Left ankle: She exhibits decreased range of motion (with inversion and dorsiflexion), swelling (overlying ATFL ) and ecchymosis ( overlying ATFL). Tenderness. AITFL and head of 5th metatarsal tenderness found. No lateral malleolus and no medial malleolus tenderness found. Achilles tendon normal.       Right lower leg: She exhibits no tenderness and no swelling.       Left lower leg: She exhibits no tenderness and  no swelling.       Right foot: Normal.       Left foot: There is decreased range of motion (with dorsiflexion), bony tenderness and swelling (overlying head of 5th metatarsal). There is normal capillary refill.  Neurological: She is alert and oriented to person, place, and time. She has normal sensation.  Pt in wheelchair today due to ankle pain.  Skin: Skin is warm and dry.  Psychiatric: Affect normal.  Vitals reviewed.  X-ray Ankle Complete Left  Result Date: 05/04/2017 CLINICAL DATA:  83 year old female with a history of  ankle injury with pain at the head of the fifth metatarsal EXAM: LEFT ANKLE COMPLETE - 3+ VIEW COMPARISON:  None. FINDINGS: Osteopenia. Fracture at the base of the fifth metatarsal. Ankle mortise congruent. No distal fibula or tibial fracture identified. Degenerative changes of the hindfoot and midfoot. IMPRESSION: The only fracture identified is at the base of the fifth metatarsal. Osteopenia. Electronically Signed   By: Corrie Mckusick D.O.   On: 05/04/2017 11:28   X-ray Foot Complete Left  Result Date: 05/04/2017 CLINICAL DATA:  Rolled left ankle yesterday. Pain at the fifth metatarsal and ATFL. EXAM: LEFT FOOT - COMPLETE 3+ VIEW COMPARISON:  Left ankle x-rays from same date. FINDINGS: There is an essentially nondisplaced avulsion fracture at the base of the fifth metatarsal. No additional fracture identified. No focal arthropathy. Small plantar and Achilles enthesophytes. Osteopenia. IMPRESSION: Essentially nondisplaced avulsion fracture at the base of the fifth metatarsal. Electronically Signed   By: Titus Dubin M.D.   On: 05/04/2017 11:16   Post ear lavage, foreign body better visualized in right ear canal. Alligator forceps used to remove foreign body, which was identified as ear bud to pt's previous hearing aid. Bilateral TM's visualized, appear normal, no erythema or bulging.   Assessment and Plan :   1. Sprain of left ankle, unspecified ligament, initial encounter - X-ray ankle complete left; Future - X-ray foot complete left  2. Closed nondisplaced fracture of fifth metatarsal bone of left foot, initial encounter Pt fitted for and given a hard sole shoe in office. Encouraged to wear hard sole shoe for the next 2 weeks. Apply ice to affected area 4-5 x a day for 20 minutes at time. Elevate foot above waist while at home. Tylenol extra strength for pain. Advance weight as tolerated. Follow up in 7 days with me for reevaluation.  - acetaminophen (TYLENOL) 500 MG tablet; Take 1 tablet (500  mg total) by mouth every 6 (six) hours as needed.  Dispense: 30 tablet; Refill: 0  3. Bilateral impacted cerumen Resolved.  - Ear wax removal  4. Allergic rhinitis, unspecified seasonality, unspecified trigger Encouraged to d/c benedryl prn and use claritin daily for better control of allergies. - loratadine (CLARITIN) 10 MG tablet; Take 1 tablet (10 mg total) by mouth daily.  Dispense: 90 tablet; Refill: 0  5. Foreign body of right ear, initial encounter Resolved.   Tenna Delaine, PA-C  [facility] 05/07/2017 11:28 PM

## 2017-05-04 NOTE — Telephone Encounter (Signed)
See note from Owens Loffler- pt would like referral to ENT

## 2017-05-04 NOTE — Telephone Encounter (Signed)
Discussed with patient in office today. She has seasonal allergies, which are not currently being treated with any medication. Will attempt trial of Claritin before referring to ENT. Pt agrees with plan.

## 2017-05-04 NOTE — Patient Instructions (Addendum)
For your fracture, you are going to wear a hard soled shoe for the next 2 weeks. Apply ice to the affected area 4-5 x a day for 20 minutes at time. Elevate the foot above your waist while at home.  You can take tylenol extra strength for pain. Advance weight as tolerated. Follow up in 7 days with me for reevaluation.   For allergies, take a claritin every day.   Avulsion Fracture of the Foot An avulsion fracture of the foot is when a piece of bone in your foot has been torn away. Bones are connected to other bones by strong bands of connective tissue (ligaments). Muscles are also connected to bones with connective tissue (tendons). Avulsion fractures occur when severe stress on a bone from a ligament or tendon causes a small piece of bone to be pulled away. Athletes may develop an avulsion fracture of the foot gradually (chronic avulsion fracture). The heel bone and the long bone in the foot that connect to the fifth toe (fifth metatarsal bone) are common areas of avulsion fracture of the foot. What are the causes? An avulsion fracture of the foot can be caused by a sudden or repetitive twisting of your foot or ankle. It can also occur during a fall from a standing height. What increases the risk? You may have a higher risk of an avulsion fracture of the foot if you:  Participate in activities during which twisting the ankle or foot are likely, such as: ? Dancing. ? Track and field. ? Walking or hiking on uneven surfaces.  Have had diabetes for many years.  Have osteoporosis.  What are the signs or symptoms? The most common symptom of an avulsion fracture of the foot is intense pain at the time of injury. You may also feel a pop or tearing. The pain continues after the injury. Other signs and symptoms may include:  Swelling.  Bruising.  Pain with movement or weight bearing.  Difficulty walking.  Pain when pressure is applied to the injured area.  Warmth over the injured area.  How  is this diagnosed? An avulsion fracture of the foot may be diagnosed by:  History. Your health care provider will ask you what occurred during the time of your injury and whether you had any pain in the area before your injury.  Physical exam. During the exam, your health care provider may try to move your foot, toes, and ankle to check for pain and level of mobility.  X-ray. This will show if any bones are fractured or out of place.  MRI. This will show your tendons and ligaments. Some avulsion fractures are associated with an injury to a tendon or ligament.  How is this treated? Treatment for an avulsion fracture of the foot depends on the size of the displaced piece of bone and how far it has been pulled out of place. Small avulsion fractures may be treated with rest and support in a cast or brace. Large fragments of bone usually need to be reattached surgically. Treatment of these fractures may also require physical therapy to regain full use of your foot. Treatments may include:  Rest, ice, compression, and elevations (RICE treatment) as directed by your health care provider.  Medicines that reduce pain and swelling (NSAIDs).  Wearing a splint, elastic wrap, support boot, or cast as directed by your health care provider.  Crutches or a rolling scooter to support your body weight until your foot heals.  Surgery to reattach the bone  and tendon or ligament.  Physical therapy. This may last for several months.  Follow these instructions at home:  Take medicines only as directed by your health care provider.  Rest your foot until your health care provider says you can resume activity.  Keep your foot raised above the level of your heart when you are sitting or lying down.  Apply ice to the injured area: ? Put ice in a plastic bag. ? Place a towel between your skin and the bag. ? Leave the ice on for 20 minutes, 2-3 times a day.  Do not allow your cast or splint to get wet as  directed by your health care provider.  Keep all follow-up visits as directed by your health care provider. This is important. Contact a health care provider if:  Your pain gets worse.  You have chills or fever.  Your cast or splint is damaged.  The cast has a bad odor or has stains caused by fluids from the wound. Get help right away if:  Your foot is cold, blue, or pale.  You have pain, swelling, redness, or numbness below your cast or splint. This information is not intended to replace advice given to you by your health care provider. Make sure you discuss any questions you have with your health care provider. Document Released: 04/24/2014 Document Revised: 03/04/2016 Document Reviewed: 12/21/2013 Elsevier Interactive Patient Education  2018 [employer].   Allergic Rhinitis Allergic rhinitis is when the mucous membranes in the nose respond to allergens. Allergens are particles in the air that cause your body to have an allergic reaction. This causes you to release allergic antibodies. Through a chain of events, these eventually cause you to release histamine into the blood stream. Although meant to protect the body, it is this release of histamine that causes your discomfort, such as frequent sneezing, congestion, and an itchy, runny nose. What are the causes? Seasonal allergic rhinitis (hay fever) is caused by pollen allergens that may come from grasses, trees, and weeds. Year-round allergic rhinitis (perennial allergic rhinitis) is caused by allergens such as house dust mites, pet dander, and mold spores. What are the signs or symptoms?  Nasal stuffiness (congestion).  Itchy, runny nose with sneezing and tearing of the eyes. How is this diagnosed? Your health care provider can help you determine the allergen or allergens that trigger your symptoms. If you and your health care provider are unable to determine the allergen, skin or blood testing may be used. Your health care  provider will diagnose your condition after taking your health history and performing a physical exam. Your health care provider may assess you for other related conditions, such as asthma, pink eye, or an ear infection. How is this treated? Allergic rhinitis does not have a cure, but it can be controlled by:  Medicines that block allergy symptoms. These may include allergy shots, nasal sprays, and oral antihistamines.  Avoiding the allergen.  Hay fever may often be treated with antihistamines in pill or nasal spray forms. Antihistamines block the effects of histamine. There are over-the-counter medicines that may help with nasal congestion and swelling around the eyes. Check with your health care provider before taking or giving this medicine. If avoiding the allergen or the medicine prescribed do not work, there are many new medicines your health care provider can prescribe. Stronger medicine may be used if initial measures are ineffective. Desensitizing injections can be used if medicine and avoidance does not work. Desensitization is when a  patient is given ongoing shots until the body becomes less sensitive to the allergen. Make sure you follow up with your health care provider if problems continue. Follow these instructions at home: It is not possible to completely avoid allergens, but you can reduce your symptoms by taking steps to limit your exposure to them. It helps to know exactly what you are allergic to so that you can avoid your specific triggers. Contact a health care provider if:  You have a fever.  You develop a cough that does not stop easily (persistent).  You have shortness of breath.  You start wheezing.  Symptoms interfere with normal daily activities. This information is not intended to replace advice given to you by your health care provider. Make sure you discuss any questions you have with your health care provider. Document Released: 06/22/2001 Document Revised:  05/28/2016 Document Reviewed: 06/04/2013 Elsevier Interactive Patient Education  2017 [employer].  IF you received an x-ray today, you will receive an invoice from [facility]. Please contact [facility] at [phone] with questions or concerns regarding your invoice.   IF you received labwork today, you will receive an invoice from [facility]. Please contact [facility] at [phone] with questions or concerns regarding your invoice.   Our billing staff will not be able to assist you with questions regarding bills from these companies.  You will be contacted with the lab results as soon as they are available. The fastest way to get your results is to activate your My Chart account. Instructions are located on the last page of this paperwork. If you have not heard from [place] regarding the results in 2 weeks, please contact this office.

## 2017-05-11 ENCOUNTER — Ambulatory Visit (INDEPENDENT_AMBULATORY_CARE_PROVIDER_SITE_OTHER): Payer: Medicare Other | Admitting: Physician Assistant

## 2017-05-11 ENCOUNTER — Encounter: Payer: Self-pay | Admitting: Physician Assistant

## 2017-05-11 VITALS — BP 97/59 | HR 61 | Temp 97.4°F | Resp 18

## 2017-05-11 DIAGNOSIS — S93402D Sprain of unspecified ligament of left ankle, subsequent encounter: Secondary | ICD-10-CM | POA: Diagnosis not present

## 2017-05-11 DIAGNOSIS — S92355D Nondisplaced fracture of fifth metatarsal bone, left foot, subsequent encounter for fracture with routine healing: Secondary | ICD-10-CM | POA: Diagnosis not present

## 2017-05-11 NOTE — Progress Notes (Signed)
MRN: [id number] DOB: [birth year]  Subjective:   Rebecca Munoz is a 83 y.o. female presenting for chief complaint of Ankle Injury (f/u- left)  Pt initially seen on 05/04/17 for left ankle pain after inverting ankle. Plain films showed nondisplaced avulsion fx of left 5th metatarsal. Pt was given hard sole shoe. Encouraged to ice, elevate, and advance weight as tolerated. Follow up in one week. Pt returns today and notes she is about 50% better since initial visit. States she has been wearing the hard sole shoe daily. She has began to ambulate to the bathroom and to/in the shower with only mild discomfort. She has consistently elevated her feet in a chair and use ice to the affected area. She has only had to use "pain pills" 2 times in the past week. Denies numbness and tingling. Pt has no other questions or concerns.   Rebecca Munoz has a current medication list which includes the following prescription(s): acetaminophen, aspirin, loratadine, multivitamin with minerals, omeprazole, and sucralfate. Also has No Known Allergies.  Rebecca Munoz  has a past medical history of Cancer ([place]) and Osteoporosis. Also  has a past surgical history that includes Eye surgery; Back surgery; Appendectomy; Joint replacement; and Breast surgery (Right).   Objective:   Vitals: BP (!) 97/59 (BP Location: Right Arm, Patient Position: Sitting, Cuff Size: Normal)   Pulse 61   Temp (!) 97.4 F (36.3 C) (Oral)   Resp 18   SpO2 96%   Physical Exam  Constitutional: She is oriented to person, place, and time. She appears well-developed and well-nourished. No distress.  HENT:  Head: Normocephalic and atraumatic.  Eyes: Conjunctivae are normal.  Neck: Normal range of motion.  Cardiovascular:  Pulses:      Dorsalis pedis pulses are 2+ on the right side, and 2+ on the left side.       Posterior tibial pulses are 2+ on the right side, and 2+ on the left side.  Pulmonary/Chest: Effort normal.  Musculoskeletal:       Right  ankle: Normal.       Left ankle: She exhibits decreased range of motion (with dorsiflexion, inversion, and eversion, plantar flexion ROM intact) and swelling (mild swelling over ATFL). She exhibits no ecchymosis and normal pulse. Tenderness. AITFL tenderness found. No lateral malleolus and no medial malleolus tenderness found. Achilles tendon normal.       Right foot: Normal.       Left foot: There is bony tenderness (over 5th metatarssal tuberosity) and swelling (over 5th metatarsal tuberosity ). There is normal capillary refill.  Ecchymosis noted over 2nd-4th toes. No tenderness with palpation of toes.   Neurological: She is alert and oriented to person, place, and time.  Skin: Skin is warm and dry.  Psychiatric: She has a normal mood and affect.  Vitals reviewed.  No results found for this or any previous visit (from the past 24 hour(s)).   Assessment and Plan :  1. Sprain of left ankle, unspecified ligament, subsequent encounter 2. Closed nondisplaced fracture of fifth metatarsal bone of left foot with routine healing, subsequent encounter Appears to be healing well. Pt encouraged continue wearing hard sole shoe. Start elevating the foot above heart level at least 3 times a day. Apply ice to affected areas. Given educational material for ankle sprain rehab exercises and instructed to start performing those as tolerated. Also, recommended advancing weight as tolerated. Return here in 2 weeks for reevaluation unless the pain worsens then return sooner.   Tenna Delaine, PA-C  [facility] 05/11/2017 11:40 AM

## 2017-05-11 NOTE — Patient Instructions (Addendum)
I would like you to continue wearing the hard sole shoe at least for the next week. Start elevating the foot above your heart level at least 3 times a day. Apply ice to the painful area and the bruised area. I have given you some exercises below that you can start doing. Stop these exercises if you begin to have pain. I also recommend advancing weight as tolerated. Return here in 2 weeks unless the pain worsens then return sooner. Thank you for letting me participate in your health and well being.    IF you received an x-ray today, you will receive an invoice from [facility] Rad[facility]. Please contact [facility] at [phone] with questions or concerns regarding your invoice.   IF you received labwork today, you will receive an invoice from [place]. Please contact [facility] at [phone] with questions or concerns regarding your invoice.   Our billing staff will not be able to assist you with questions regarding bills from these companies.  You will be contacted with the lab results as soon as they are available. The fastest way to get your results is to activate your My Chart account. Instructions are located on the last page of this paperwork. If you have not heard from [place] regarding the results in 2 weeks, please contact this office.     Ankle Sprain, Phase I Rehab Ask your health care provider which exercises are safe for you. Do exercises exactly as told by your health care provider and adjust them as directed. It is normal to feel mild stretching, pulling, tightness, or discomfort as you do these exercises, but you should stop right away if you feel sudden pain or your pain gets worse.Do not begin these exercises until told by your health care provider. Stretching and range of motion exercises These exercises warm up your muscles and joints and improve the movement and flexibility of your lower leg and ankle. These exercises also help to relieve pain and stiffness. Exercise A:  Gastroc and soleus stretch  1. Sit on the floor with your left / right leg extended. 2. Loop a belt or towel around the ball of your left / right foot. The ball of your foot is on the walking surface, right under your toes. 3. Keep your left / right ankle and foot relaxed and keep your knee straight while you use the belt or towel to pull your foot toward you. You should feel a gentle stretch behind your calf or knee. 4. Hold this position for __________ seconds, then release to the starting position. Repeat the exercise with your knee bent. You can put a pillow or a rolled bath towel under your knee to support it. You should feel a stretch deep in your calf or at your Achilles tendon. Repeat each stretch __________ times. Complete these stretches __________ times a day. Exercise B: Ankle alphabet  1. Sit with your left / right leg supported at the lower leg. ? Do not rest your foot on anything. ? Make sure your foot has room to move freely. 2. Think of your left / right foot as a paintbrush, and move your foot to trace each letter of the alphabet in the air. Keep your hip and knee still while you trace. Make the letters as large as you can without feeling discomfort. 3. Trace every letter from A to Z. Repeat __________ times. Complete this exercise __________ times a day. Strengthening exercises These exercises build strength and endurance in your ankle and lower leg.  Endurance is the ability to use your muscles for a long time, even after they get tired. Exercise C: Dorsiflexors  1. Secure a rubber exercise band or tube to an object, such as a table leg, that will stay still when the band is pulled. Secure the other end around your left / right foot. 2. Sit on the floor facing the object, with your left / right leg extended. The band or tube should be slightly tense when your foot is relaxed. 3. Slowly bring your foot toward you, pulling the band tighter. 4. Hold this position for __________  seconds. 5. Slowly return your foot to the starting position. Repeat __________ times. Complete this exercise __________ times a day. Exercise D: Plantar flexors  1. Sit on the floor with your left / right leg extended. 2. Loop a rubber exercise tube or band around the ball of your left / right foot. The ball of your foot is on the walking surface, right under your toes. ? Hold the ends of the band or tube in your hands. ? The band or tube should be slightly tense when your foot is relaxed. 3. Slowly point your foot and toes downward, pushing them away from you. 4. Hold this position for __________ seconds. 5. Slowly return your foot to the starting position. Repeat __________ times. Complete this exercise __________ times a day. Exercise E: Evertors 1. Sit on the floor with your legs straight out in front of you. 2. Loop a rubber exercise band or tube around the ball of your left / right foot. The ball of your foot is on the walking surface, right under your toes. ? Hold the ends of the band in your hands, or secure the band to a stable object. ? The band or tube should be slightly tense when your foot is relaxed. 3. Slowly push your foot outward, away from your other leg. 4. Hold this position for __________ seconds. 5. Slowly return your foot to the starting position. Repeat __________ times. Complete this exercise __________ times a day. This information is not intended to replace advice given to you by your health care provider. Make sure you discuss any questions you have with your health care provider. Document Released: 04/28/2005 Document Revised: 06/03/2016 Document Reviewed: 08/11/2015 Elsevier Interactive Patient Education  2018 [employer].

## 2017-05-25 ENCOUNTER — Encounter: Payer: Self-pay | Admitting: Physician Assistant

## 2017-05-25 ENCOUNTER — Ambulatory Visit (INDEPENDENT_AMBULATORY_CARE_PROVIDER_SITE_OTHER): Payer: Medicare Other | Admitting: Physician Assistant

## 2017-05-25 VITALS — BP 100/64 | HR 69 | Temp 98.0°F | Resp 18 | Ht 65.0 in | Wt 133.0 lb

## 2017-05-25 DIAGNOSIS — S93402D Sprain of unspecified ligament of left ankle, subsequent encounter: Secondary | ICD-10-CM

## 2017-05-25 NOTE — Progress Notes (Signed)
MRN: [id number] DOB: 18-Mar-1921  Subjective:   Pt initially seen on 05/04/17 for left ankle pain after inverting ankle. Plain films showed nondisplaced avulsion fx of left 5th metatarsal. Pt was given hard sole shoe. Encouraged to ice, elevate, and advance weight as tolerated. She followed up 2 weeks ago and was still having some discomfort ambulating. She had some bruising over her 2nd-4th toes. She was instructed to continue wearing the hard sole shoe, elevate, and ice. Given ankle ROM exercises to perform. Encouraged to follow up in 2 weeks.  Today, she states she is much better. She has been able to do her daily routine without any pain. She has been wearing her hard sole shoe daily. She has elevated and iced as instructed. She has also been performing ankle ROM exercises frequently without pain. She does not that she has been having some throbbing in the her toes of her left foot in the middle of the night that will resolve if she rubs it.  Denies numbness and tingling. Pt has no other questions or concerns.   Mackenze has a current medication list which includes the following prescription(s): acetaminophen, aspirin, loratadine, multivitamin with minerals, omeprazole, and sucralfate. Also has No Known Allergies.  Kumari  has a past medical history of Cancer ([place]) and Osteoporosis. Also  has a past surgical history that includes Eye surgery; Back surgery; Appendectomy; Joint replacement; and Breast surgery (Right).   Objective:   Vitals: BP 100/64   Pulse 69   Temp 98 F (36.7 C) (Oral)   Resp 18   Ht 5\' 5"  (1.651 m)   Wt 133 lb (60.3 kg)   SpO2 94%   BMI 22.13 kg/m   Physical Exam  Constitutional: She is oriented to person, place, and time. She appears well-developed and well-nourished.  HENT:  Head: Normocephalic and atraumatic.  Eyes: Conjunctivae are normal.  Neck: Normal range of motion.  Cardiovascular:  Pulses:      Dorsalis pedis pulses are 2+ on the right side, and 2+  on the left side.       Posterior tibial pulses are 2+ on the right side, and 2+ on the left side.  Pulmonary/Chest: Effort normal.  Musculoskeletal:       Left ankle: She exhibits swelling (mild over ATFL). She exhibits normal range of motion, no ecchymosis and normal pulse. No tenderness. No lateral malleolus, no medial malleolus, no AITFL and no head of 5th metatarsal tenderness found. Achilles tendon normal.       Right lower leg: She exhibits no swelling.       Left lower leg: She exhibits no swelling.       Left foot: There is no tenderness, no bony tenderness and normal capillary refill.  Neurological: She is alert and oriented to person, place, and time.  Skin: Skin is warm and dry.  Psychiatric: She has a normal mood and affect.  Vitals reviewed.   No results found for this or any previous visit (from the past 24 hour(s)).  Assessment and Plan :  1. Sprain of left ankle, unspecified ligament, subsequent encounter Appropriate healing noted. Pt does have some mild swelling but has full ROM and no tenderness with palpation or ambulation at this time. Instructed to continue wearing hard sole shoe. Continue elevated and ice as needed for pain and swelling. Continue ankle ROM exercises. Encouraged to use tylenol as prescribed before bed to see if this helps with foot pain she is experiencing while asleep. Continue advancing  weight as tolerated. Follow up in 3 weeks for reevaluation. Will repeat imaging at this time to ensure proper healing.   Tenna Delaine, PA-C  [facility] 05/25/2017 3:15 PM

## 2017-05-25 NOTE — Patient Instructions (Addendum)
For your foot, continue to advance weight as tolerated. Wear the hard sole shoe for the next 3 weeks. Ice and elevate as needed for swelling and pain. Take tylenol at night as needed for pain and see if this will help with that throbbing sensation. If your tylenol is 325mg , take 2, if it is 500mg , take 1 tablet. I also recommend doing the ankle range of motion exercises every morning to help decrease risk of ankle stiffness. Follow up in 3 weeks for repeat imaging. As always, it was a pleasure seeing you today.    Ankle Sprain, Phase I Rehab Ask your health care provider which exercises are safe for you. Do exercises exactly as told by your health care provider and adjust them as directed. It is normal to feel mild stretching, pulling, tightness, or discomfort as you do these exercises, but you should stop right away if you feel sudden pain or your pain gets worse.Do not begin these exercises until told by your health care provider. Stretching and range of motion exercises These exercises warm up your muscles and joints and improve the movement and flexibility of your lower leg and ankle. These exercises also help to relieve pain and stiffness. Exercise A: Gastroc and soleus stretch  1. Sit on the floor with your left / right leg extended. 2. Loop a belt or towel around the ball of your left / right foot. The ball of your foot is on the walking surface, right under your toes. 3. Keep your left / right ankle and foot relaxed and keep your knee straight while you use the belt or towel to pull your foot toward you. You should feel a gentle stretch behind your calf or knee. 4. Hold this position for __________ seconds, then release to the starting position. Repeat the exercise with your knee bent. You can put a pillow or a rolled bath towel under your knee to support it. You should feel a stretch deep in your calf or at your Achilles tendon. Repeat each stretch __________ times. Complete these stretches  __________ times a day. Exercise B: Ankle alphabet  1. Sit with your left / right leg supported at the lower leg. ? Do not rest your foot on anything. ? Make sure your foot has room to move freely. 2. Think of your left / right foot as a paintbrush, and move your foot to trace each letter of the alphabet in the air. Keep your hip and knee still while you trace. Make the letters as large as you can without feeling discomfort. 3. Trace every letter from A to Z. Repeat __________ times. Complete this exercise __________ times a day. Strengthening exercises These exercises build strength and endurance in your ankle and lower leg. Endurance is the ability to use your muscles for a long time, even after they get tired. Exercise C: Dorsiflexors  1. Secure a rubber exercise band or tube to an object, such as a table leg, that will stay still when the band is pulled. Secure the other end around your left / right foot. 2. Sit on the floor facing the object, with your left / right leg extended. The band or tube should be slightly tense when your foot is relaxed. 3. Slowly bring your foot toward you, pulling the band tighter. 4. Hold this position for __________ seconds. 5. Slowly return your foot to the starting position. Repeat __________ times. Complete this exercise __________ times a day. Exercise D: Plantar flexors  1. Sit on  the floor with your left / right leg extended. 2. Loop a rubber exercise tube or band around the ball of your left / right foot. The ball of your foot is on the walking surface, right under your toes. ? Hold the ends of the band or tube in your hands. ? The band or tube should be slightly tense when your foot is relaxed. 3. Slowly point your foot and toes downward, pushing them away from you. 4. Hold this position for __________ seconds. 5. Slowly return your foot to the starting position. Repeat __________ times. Complete this exercise __________ times a day. Exercise E:  Evertors 1. Sit on the floor with your legs straight out in front of you. 2. Loop a rubber exercise band or tube around the ball of your left / right foot. The ball of your foot is on the walking surface, right under your toes. ? Hold the ends of the band in your hands, or secure the band to a stable object. ? The band or tube should be slightly tense when your foot is relaxed. 3. Slowly push your foot outward, away from your other leg. 4. Hold this position for __________ seconds. 5. Slowly return your foot to the starting position. Repeat __________ times. Complete this exercise __________ times a day. This information is not intended to replace advice given to you by your health care provider. Make sure you discuss any questions you have with your health care provider. Document Released: 04/28/2005 Document Revised: 06/03/2016 Document Reviewed: 08/11/2015 Elsevier Interactive Patient Education  2018 [employer].     IF you received an x-ray today, you will receive an invoice from [facility]. Please contact [facility] at [phone] with questions or concerns regarding your invoice.   IF you received labwork today, you will receive an invoice from [facility]. Please contact [facility] at [phone] with questions or concerns regarding your invoice.   Our billing staff will not be able to assist you with questions regarding bills from these companies.  You will be contacted with the lab results as soon as they are available. The fastest way to get your results is to activate your My Chart account. Instructions are located on the last page of this paperwork. If you have not heard from [place] regarding the results in 2 weeks, please contact this office.

## 2017-05-26 ENCOUNTER — Telehealth: Payer: Self-pay | Admitting: Family Medicine

## 2017-05-26 NOTE — Telephone Encounter (Signed)
lmom for pt to reschedule an OV with Timmothy Euler that she had with her on 06-15-17 Timmothy Euler will be out of the office that day couldn't reach anyone on the home number lmom on sons machine

## 2017-06-15 ENCOUNTER — Ambulatory Visit: Payer: Self-pay | Admitting: Physician Assistant

## 2017-06-17 ENCOUNTER — Emergency Department (HOSPITAL_COMMUNITY)
Admission: EM | Admit: 2017-06-17 | Discharge: 2017-06-18 | Disposition: A | Discharge status: Skilled Nursing Facility | Payer: Medicare Other | Attending: Emergency Medicine | Admitting: Emergency Medicine

## 2017-06-17 ENCOUNTER — Emergency Department (HOSPITAL_COMMUNITY): Payer: Medicare Other

## 2017-06-17 ENCOUNTER — Encounter (HOSPITAL_COMMUNITY): Payer: Self-pay | Admitting: Emergency Medicine

## 2017-06-17 DIAGNOSIS — Z79899 Other long term (current) drug therapy: Secondary | ICD-10-CM | POA: Insufficient documentation

## 2017-06-17 DIAGNOSIS — S4991XA Unspecified injury of right shoulder and upper arm, initial encounter: Secondary | ICD-10-CM | POA: Diagnosis present

## 2017-06-17 DIAGNOSIS — S42201A Unspecified fracture of upper end of right humerus, initial encounter for closed fracture: Secondary | ICD-10-CM | POA: Diagnosis not present

## 2017-06-17 DIAGNOSIS — Z7982 Long term (current) use of aspirin: Secondary | ICD-10-CM | POA: Insufficient documentation

## 2017-06-17 DIAGNOSIS — S42211A Unspecified displaced fracture of surgical neck of right humerus, initial encounter for closed fracture: Secondary | ICD-10-CM | POA: Diagnosis not present

## 2017-06-17 DIAGNOSIS — Y939 Activity, unspecified: Secondary | ICD-10-CM | POA: Diagnosis not present

## 2017-06-17 DIAGNOSIS — W010XXA Fall on same level from slipping, tripping and stumbling without subsequent striking against object, initial encounter: Secondary | ICD-10-CM | POA: Insufficient documentation

## 2017-06-17 DIAGNOSIS — W19XXXA Unspecified fall, initial encounter: Secondary | ICD-10-CM

## 2017-06-17 DIAGNOSIS — M25551 Pain in right hip: Secondary | ICD-10-CM | POA: Insufficient documentation

## 2017-06-17 DIAGNOSIS — T148XXA Other injury of unspecified body region, initial encounter: Secondary | ICD-10-CM | POA: Diagnosis not present

## 2017-06-17 DIAGNOSIS — Y929 Unspecified place or not applicable: Secondary | ICD-10-CM | POA: Insufficient documentation

## 2017-06-17 DIAGNOSIS — Z853 Personal history of malignant neoplasm of breast: Secondary | ICD-10-CM | POA: Insufficient documentation

## 2017-06-17 DIAGNOSIS — S329XXA Fracture of unspecified parts of lumbosacral spine and pelvis, initial encounter for closed fracture: Secondary | ICD-10-CM | POA: Diagnosis not present

## 2017-06-17 DIAGNOSIS — R52 Pain, unspecified: Secondary | ICD-10-CM

## 2017-06-17 DIAGNOSIS — Z9660 Presence of unspecified orthopedic joint implant: Secondary | ICD-10-CM | POA: Insufficient documentation

## 2017-06-17 DIAGNOSIS — Y999 Unspecified external cause status: Secondary | ICD-10-CM | POA: Insufficient documentation

## 2017-06-17 LAB — BASIC METABOLIC PANEL
ANION GAP: 10 (ref 5–15)
BUN: 27 mg/dL — AB (ref 6–20)
CHLORIDE: 103 mmol/L (ref 101–111)
CO2: 25 mmol/L (ref 22–32)
Calcium: 9.9 mg/dL (ref 8.9–10.3)
Creatinine, Ser: 0.81 mg/dL (ref 0.44–1.00)
GFR calc Af Amer: >60 mL/min (ref >60)
Glucose, Bld: 186 mg/dL — ABNORMAL HIGH (ref 65–99)
POTASSIUM: 4 mmol/L (ref 3.5–5.1)
SODIUM: 138 mmol/L (ref 135–145)

## 2017-06-17 LAB — CBC WITH DIFFERENTIAL/PLATELET
BASOS ABS: 0 10*3/uL (ref 0.0–0.1)
Basophils Relative: 0 %
EOS ABS: 0 10*3/uL (ref 0.0–0.7)
Eosinophils Relative: 0 %
HCT: 38.3 % (ref 36.0–46.0)
HEMOGLOBIN: 12.8 g/dL (ref 12.0–15.0)
LYMPHS ABS: 0.7 10*3/uL (ref 0.7–4.0)
LYMPHS PCT: 6 %
MCH: 30.5 pg (ref 26.0–34.0)
MCHC: 33.4 g/dL (ref 30.0–36.0)
MCV: 91.2 fL (ref 78.0–100.0)
Monocytes Absolute: 0.8 10*3/uL (ref 0.1–1.0)
Monocytes Relative: 7 %
NEUTROS PCT: 87 %
Neutro Abs: 10.1 10*3/uL — ABNORMAL HIGH (ref 1.7–7.7)
Platelets: 164 10*3/uL (ref 150–400)
RBC: 4.2 MIL/uL (ref 3.87–5.11)
RDW: 13.8 % (ref 11.5–15.5)
WBC: 11.6 10*3/uL — AB (ref 4.0–10.5)

## 2017-06-17 MED ORDER — FENTANYL CITRATE (PF) 100 MCG/2ML IJ SOLN
50.0000 ug | Freq: Once | INTRAMUSCULAR | Status: AC
  Administered 2017-06-17: 50 ug via INTRAVENOUS
  Filled 2017-06-17: qty 2

## 2017-06-17 NOTE — ED Triage Notes (Signed)
Brought in by EMS from Urgent Care with c/o right humeral fracture and right pelvic fracture.  Pt has had a mechanical fall today---- tripped on the carpet and has had immediate pain to right shoulder and right hip; pt went to urgent care where x-rays were done to right shoulder and right pelvis/hip.  Pt was sent here for further evaluation and treatment.

## 2017-06-17 NOTE — ED Notes (Signed)
Bed: VI71 Expected date:  Expected time:  Means of arrival:  Comments: 83 yo from urgent care

## 2017-06-17 NOTE — ED Provider Notes (Signed)
[facility] DEPT Provider Note   CSN: [id number] Arrival date & time: 06/17/17  2159     History   Chief Complaint Chief Complaint  Patient presents with  . Fall  . Arm/Hip Injury    HPI Rebecca Munoz is a 83 y.o. female.  Patient presents emergency department with chief complaint of fall. She fell this evening around dinnertime after tripping on a rug. She states that she was then taken to an urgent care where she had imaging of her right shoulder and right hip. Portably she had a right humerus and right hip fracture. These films are unavailable. Patient reports moderate to severe pain. The pain is worsened with movement. She denies hitting her head. Denies any LOC. Denies any neck pain. She denies any weakness, numbness, or tingling. She has not taken anything for her symptoms.   The history is provided by the patient. No language interpreter was used.    Past Medical History:  Diagnosis Date  . Cancer ([place])    breast  . Osteoporosis     There are no active problems to display for this patient.   Past Surgical History:  Procedure Laterality Date  . APPENDECTOMY    . BACK SURGERY    . BREAST SURGERY Right    lumpectomy   . EYE SURGERY    . JOINT REPLACEMENT      OB History    No data available       Home Medications    Prior to Admission medications   Medication Sig Start Date End Date Taking? Authorizing Provider  acetaminophen (TYLENOL) 500 MG tablet Take 1 tablet (500 mg total) by mouth every 6 (six) hours as needed. 05/04/17   Tenna Delaine D, PA-C  aspirin 81 MG tablet Take 81 mg by mouth daily.    [provider]  loratadine (CLARITIN) 10 MG tablet Take 1 tablet (10 mg total) by mouth daily. 05/04/17   Tenna Delaine D, PA-C  Multiple Vitamins-Minerals (MULTIVITAMIN WITH MINERALS) tablet Take 1 tablet by mouth daily.    [provider]  omeprazole (PRILOSEC) 20 MG capsule Take 1 capsule (20 mg total) by mouth daily as needed.  04/27/17   Tenna Delaine D, PA-C  sucralfate (CARAFATE) 1 g tablet Take 1 tablet (1 g total) by mouth daily with breakfast. 03/02/17   Leonie Douglas, PA-C    Family History History reviewed. No pertinent family history.  Social History Social History  Substance Use Topics  . Smoking status: Never Smoker  . Smokeless tobacco: Never Used  . Alcohol use No     Allergies   Patient has no known allergies.   Review of Systems Review of Systems  All other systems reviewed and are negative.    Physical Exam Updated Vital Signs BP (!) 159/64 (BP Location: Left Arm)   Pulse 71   Temp 98 F (36.7 C) (Oral)   Resp 20   Ht 5\' 7"  (1.702 m)   Wt 61.2 kg (134 lb 14.7 oz)   SpO2 97%   BMI 21.13 kg/m   Physical Exam  Constitutional: She is oriented to person, place, and time. She appears well-developed and well-nourished.  HENT:  Head: Normocephalic and atraumatic.  Eyes: Pupils are equal, round, and reactive to light. Conjunctivae and EOM are normal.  Neck: Normal range of motion. Neck supple.  Cardiovascular: Normal rate and regular rhythm.  Exam reveals no gallop and no friction rub.   No murmur heard. Pulmonary/Chest: Effort normal and  breath sounds normal. No respiratory distress. She has no wheezes. She has no rales. She exhibits no tenderness.  Abdominal: Soft. Bowel sounds are normal. She exhibits no distension and no mass. There is no tenderness. There is no rebound and no guarding.  Musculoskeletal: Normal range of motion. She exhibits tenderness. She exhibits no edema.  Tenderness palpation of the right shoulder and right humerus, range of motion strength is limited secondary to pain, no obvious bony abnormality or deformity  Tenderness palpation of the right hip, range of motion strength is limited secondary to pain, no obvious bony abnormality or deformity  Neurological: She is alert and oriented to person, place, and time.  Skin: Skin is warm and dry.    Psychiatric: She has a normal mood and affect. Her behavior is normal. Judgment and thought content normal.  Nursing note and vitals reviewed.    ED Treatments / Results  Labs (all labs ordered are listed, but only abnormal results are displayed) Labs Reviewed  CBC WITH DIFFERENTIAL/PLATELET  BASIC METABOLIC PANEL    EKG  EKG Interpretation None       Radiology No results found.  Procedures Procedures (including critical care time)  Medications Ordered in ED Medications  fentaNYL (SUBLIMAZE) injection 50 mcg (not administered)     Initial Impression / Assessment and Plan / ED Course  I have reviewed the triage vital signs and the nursing notes.  Pertinent labs & imaging results that were available during my care of the patient were reviewed by me and considered in my medical decision making (see chart for details).     Patient with mechanical fall to stay. Sent from urgent care with fractures of her right humerus and right pelvis. These films are unavailable at this time, we'll repeat imaging in order to further manage care.  10:52 PM Attempted to reach pts son, who is not answering.    Patient unable to ambulate.  Normally walks with a walker, but will be unable to use this due to her humerus fracture.  She will likely need increased level of care at her nursing home and possibly PT/OT and maybe placement in a rehab center.  Patient discussed with Dr. Venora Maples, who agrees with plan to consult case management in the am to assist with home needs and placement.  Final Clinical Impressions(s) / ED Diagnoses   Final diagnoses:  Fall  Closed displaced fracture of surgical neck of right humerus, unspecified fracture morphology, initial encounter    New Prescriptions New Prescriptions   No medications on file     Montine Circle, Hershal Coria 06/18/17 0601    Jola Schmidt, MD 06/18/17 [phone]

## 2017-06-18 DIAGNOSIS — G8911 Acute pain due to trauma: Secondary | ICD-10-CM | POA: Diagnosis not present

## 2017-06-18 DIAGNOSIS — S4990XA Unspecified injury of shoulder and upper arm, unspecified arm, initial encounter: Secondary | ICD-10-CM | POA: Diagnosis not present

## 2017-06-18 DIAGNOSIS — S42211A Unspecified displaced fracture of surgical neck of right humerus, initial encounter for closed fracture: Secondary | ICD-10-CM | POA: Diagnosis not present

## 2017-06-18 MED ORDER — ACETAMINOPHEN 500 MG PO TABS: 500.0000 mg | ORAL_TABLET | Freq: Four times a day (QID) | ORAL | 0 refills | Status: DC | PRN

## 2017-06-18 MED ORDER — OXYCODONE-ACETAMINOPHEN 5-325 MG PO TABS: 1.0000 | ORAL_TABLET | ORAL | 0 refills | Status: DC | PRN

## 2017-06-18 MED ORDER — ACETAMINOPHEN 325 MG PO TABS
650.0000 mg | ORAL_TABLET | Freq: Once | ORAL | Status: AC
  Administered 2017-06-18: 650 mg via ORAL
  Filled 2017-06-18: qty 2

## 2017-06-18 MED ORDER — OXYCODONE-ACETAMINOPHEN 5-325 MG PO TABS: 1.0000 | ORAL_TABLET | Freq: Once | ORAL | Status: DC

## 2017-06-18 NOTE — Progress Notes (Signed)
Patient has accepted bed offer at [facility]. Facility has been informed of patient and family's selection. Per facility representative, patient can arrive to facility after 4:15pm. CSW informed patient and family of transfer time. Patient and family appreciative of CSW services.  Patient to be transported via [facility] to  [facility] around 4:15. D/C orders to be submitted once received. Number to call report is [phone].  No further needs were requested at this time. CSW to sign off.   Please re-consult if further CSW needs arise.   Lucius Conn, [place] Worker [facility] Ph: [phone]

## 2017-06-18 NOTE — ED Notes (Signed)
Pt's contact:  Shianna Bally (son)---- tel# [phone]

## 2017-06-18 NOTE — Care Management Note (Addendum)
Case Management Note  Patient Details  Name: Rebecca Munoz MRN: [id number] Date of Birth: January 09, 1921  Subjective/Objective: CM received consult for this 83 y.o. F who sustained fall and Fx Humeral Head in her [place]. Face sheet lists [facility] which could be [employer].  Spoke with Rosemarie Ax the PA-c who would like assist with obtaining "higher level of care" for this pt as she has fallen. CM is unsure if it is safe for her to return to [place] and will need PT eval which has been ordered. CM alerted CSW assigned to [facility]. Will follow for recommendations re: DME, etc.                   Action/Plan:CM will follow closely for disposition/discharge needs.    Expected Discharge Date:                  Expected Discharge Plan:  Home/Self Care  In-House Referral:  Clinical Social Work  Discharge planning Services  CM Consult  Post Acute Care Choice:  Durable Medical Equipment, Home Health Choice offered to:     DME Arranged:    DME Agency:     HH Arranged:    [place] Agency:     Status of Service:  In process, will continue to follow  If discussed at Long Length of Stay Meetings, dates discussed:    Additional Comments: Spoke with pt by phone who gave me permission to speak with son : Linna Hoff @ [phone] who "handles all of her affairs, as she believes [facility] could provide a sitter for her if she is not admitted.   CrutchfieldAntony Haste, RN 06/18/2017, 9:12 AM

## 2017-06-18 NOTE — Discharge Instructions (Signed)
You have a proximal right humerus fracture. This is treated with immobilization, anti-inflammatories, pain control, rehab/PT and outpatient orthopedic follow up.  Call Dr Doran Durand to establish care and make an appointment for re-evaluation.  Wear your shoulder immobilizer until you are re-evaluated by orthopedist  For pain:  Take tylenol 500 mg (1 tab) for mild pain. Take tylenol 1000 mg (2 tabs) moderate pain.  Take tylenol 500 mg PLUS 1 percocet for severe pain  Return to ED for worsening pain not controlled by oral medications, numbness or skin color changes to your hand

## 2017-06-18 NOTE — Care Management Note (Signed)
Case Management Note  Patient Details  Name: Rebecca Munoz MRN: [id number] Date of Birth: 15-Oct-1920  Subjective/Objective:                    Action/Plan:   Expected Discharge Date:                  Expected Discharge Plan:  Home/Self Care  In-House Referral:  Clinical Social Work  Discharge planning Services  CM Consult  Post Acute Care Choice:  Durable Medical Equipment, Home Health Choice offered to:  Patient  DME Arranged:  [employer] worker wheelchair with seat cushion DME Agency:  [place]:    [facility] Agency:     Status of Service:  In process, will continue to follow  If discussed at Long Length of Stay Meetings, dates discussed:    Additional Comments:  Delrae Sawyers, RN 06/18/2017, 10:15 AM

## 2017-06-18 NOTE — Clinical Social Work Placement (Signed)
   CLINICAL SOCIAL WORK PLACEMENT  NOTE  Date:  06/18/2017  Patient Details  Name: Rebecca Munoz MRN: [id number] Date of Birth: Dec 27, 1920  Clinical Social Work is seeking post-discharge placement for this patient at the [place] level of care (*CSW will initial, date and re-position this form in  chart as items are completed):  Yes   Patient/family provided with [facility] Work Department's list of facilities offering this level of care within the geographic area requested by the patient (or if unable, by the patient's family).  Yes   Patient/family informed of their freedom to choose among providers that offer the needed level of care, that participate in Medicare, Medicaid or managed care program needed by the patient, have an available bed and are willing to accept the patient.  Yes   Patient/family informed of [facility]'s ownership interest in [facility] and [facility], as well as of the fact that they are under no obligation to receive care at these facilities.  PASRR submitted to EDS on 06/18/17     PASRR number received on 06/18/17     Existing PASRR number confirmed on       FL2 transmitted to all facilities in geographic area requested by pt/family on 06/18/17     FL2 transmitted to all facilities within larger geographic area on 06/18/17     Patient informed that his/her managed care company has contracts with or will negotiate with certain facilities, including the following:        Yes   Patient/family informed of bed offers received.  Patient chooses bed at  ([facility])     Physician recommends and patient chooses bed at      Patient to be transferred to  ([facility] ) on 06/18/17.  Patient to be transferred to facility by  Corey Harold)     Patient family notified on 06/18/17 of transfer.  Name of family member notified:   Richrd Prime: Son)     PHYSICIAN Please sign FL2, Please prepare  prescriptions, Please prepare priority discharge summary, including medications     Additional Comment:    _______________________________________________ Raymondo Band, LCSWA 06/18/2017, 3:04 PM

## 2017-06-18 NOTE — NC FL2 (Signed)
  [place] LEVEL OF CARE SCREENING TOOL     IDENTIFICATION  Patient Name: Rebecca Munoz Birthdate: August 01, 1921 Sex: female Admission Date (Current Location): 06/17/2017  [facility] and [place] Number:  Herbalist and Address:  [facility],  [place] [address] [place]      Provider Number: [id number]  Attending Physician Name and Address:  Jola Schmidt, MD  Relative Name and Phone Number:       Current Level of Care: Hospital Recommended Level of Care: [place] Prior Approval Number:    Date Approved/Denied:   PASRR Number:    Discharge Plan: SNF    Current Diagnoses: There are no active problems to display for this patient.   Orientation RESPIRATION BLADDER Height & Weight     Self, Time, Situation, Place  Normal Continent Weight: 134 lb 14.7 oz (61.2 kg) Height:  5\' 7"  (170.2 cm)  BEHAVIORAL SYMPTOMS/MOOD NEUROLOGICAL BOWEL NUTRITION STATUS      Continent    AMBULATORY STATUS COMMUNICATION OF NEEDS Skin   Limited Assist Verbally Normal                       Personal Care Assistance Level of Assistance  Bathing, Feeding, Dressing Bathing Assistance: Limited assistance Feeding assistance: Independent Dressing Assistance: Limited assistance     Functional Limitations Info  Sight, Hearing, Speech Sight Info: Adequate Hearing Info: Impaired Speech Info: Adequate    SPECIAL CARE FACTORS FREQUENCY  PT (By licensed PT)     PT Frequency: 3 times a week              Contractures      Additional Factors Info                  Current Medications (06/18/2017):  This is the current hospital active medication list Current Facility-Administered Medications  Medication Dose Route Frequency Provider Last Rate Last Dose  . oxyCODONE-acetaminophen (PERCOCET/ROXICET) 5-325 MG per tablet 1 tablet  1 tablet Oral Once Kinnie Feil, PA-C       Current Outpatient Prescriptions  Medication Sig  Dispense Refill  . aspirin 81 MG tablet Take 81 mg by mouth daily.    Marland Kitchen CALCIUM PO Take 1 tablet by mouth daily.    Marland Kitchen loratadine (CLARITIN) 10 MG tablet Take 1 tablet (10 mg total) by mouth daily. 90 tablet 0  . Multiple Vitamins-Minerals (MULTIVITAMIN WITH MINERALS) tablet Take 1 tablet by mouth daily.    Marland Kitchen omeprazole (PRILOSEC) 20 MG capsule Take 1 capsule (20 mg total) by mouth daily as needed. (Patient taking differently: Take 20 mg by mouth daily as needed (reflux). ) 30 capsule 0  . acetaminophen (TYLENOL) 500 MG tablet Take 1 tablet (500 mg total) by mouth every 6 (six) hours as needed. 30 tablet 0  . oxyCODONE-acetaminophen (PERCOCET/ROXICET) 5-325 MG tablet Take 1 tablet by mouth every 4 (four) hours as needed for severe pain. 15 tablet 0  . sucralfate (CARAFATE) 1 g tablet Take 1 tablet (1 g total) by mouth daily with breakfast. (Patient not taking: Reported on 06/17/2017) 60 tablet 0     Discharge Medications: Please see discharge summary for a list of discharge medications.  Relevant Imaging Results:  Relevant Lab Results:   Additional Information SS#: [id number]   Raymondo Band, LCSWA

## 2017-06-18 NOTE — ED Provider Notes (Signed)
Patient was handed off to me by previous ED PA [place] at shift change pending case management consult to assist with disposition. Please see previous note for full history of present illness, physical exam and ROS. Briefly, patient is a76 year old female presents to ED for evaluation of right shoulder, right arm and right hip pain after mechanical fall. ED workup shows proximal right humerus fracture but no new bony pathology in shoulder or hip or pelvis.  0900: I was contacted by case management who is recommending social work and PT/OT consults in ED to assist in further care management. Patient has Medicare and rehabilitation placement may be difficult without admission. We'll wait for social work and PT/OT consult for further changes in disposition.  1100: Pt's son requesting to speak to a provider requesting update. Spent 20+ min talking to patient regarding ED work up, results and plan to d/c. Answered all his questions. Will plan on d/c with HH/PT/OT at pt's living facility, pain control and ortho f/u as outpatient. Pt already has wheelchair. Also discussed placing pt in rehab center however Medicare will not cover this unless pt meet inpatient criteria x 3 days. Pt's son states money is not an obstacle, he will explore these options after discharge. Pt and pt's son were agreeable to this. He does not want to leave until "things are in order". Pending HH/PT/OT consult and patient will be ready for d/c   Arlean Hopping 06/18/17 [place], Kevin, MD 06/19/17 [phone]

## 2017-06-20 ENCOUNTER — Telehealth: Payer: Self-pay | Admitting: Family Medicine

## 2017-06-20 DIAGNOSIS — R278 Other lack of coordination: Secondary | ICD-10-CM | POA: Diagnosis not present

## 2017-06-20 DIAGNOSIS — M6281 Muscle weakness (generalized): Secondary | ICD-10-CM | POA: Diagnosis not present

## 2017-06-20 DIAGNOSIS — K219 Gastro-esophageal reflux disease without esophagitis: Secondary | ICD-10-CM | POA: Diagnosis not present

## 2017-06-20 DIAGNOSIS — R2689 Other abnormalities of gait and mobility: Secondary | ICD-10-CM | POA: Diagnosis not present

## 2017-06-20 DIAGNOSIS — Z4789 Encounter for other orthopedic aftercare: Secondary | ICD-10-CM | POA: Diagnosis not present
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
## 2017-06-21 DIAGNOSIS — R278 Other lack of coordination: Secondary | ICD-10-CM | POA: Diagnosis not present

## 2017-06-21 DIAGNOSIS — W19XXXD Unspecified fall, subsequent encounter: Secondary | ICD-10-CM | POA: Diagnosis not present

## 2017-06-21 DIAGNOSIS — C50911 Malignant neoplasm of unspecified site of right female breast: Secondary | ICD-10-CM | POA: Diagnosis not present

## 2017-06-21 DIAGNOSIS — K219 Gastro-esophageal reflux disease without esophagitis: Secondary | ICD-10-CM | POA: Diagnosis not present

## 2017-06-21 DIAGNOSIS — M6281 Muscle weakness (generalized): Secondary | ICD-10-CM | POA: Diagnosis not present

## 2017-06-21 DIAGNOSIS — Z4789 Encounter for other orthopedic aftercare: Secondary | ICD-10-CM | POA: Diagnosis not present

## 2017-06-21 DIAGNOSIS — S42291D Other displaced fracture of upper end of right humerus, subsequent encounter for fracture with routine healing: Secondary | ICD-10-CM | POA: Diagnosis not present

## 2017-06-21 DIAGNOSIS — R2689 Other abnormalities of gait and mobility: Secondary | ICD-10-CM | POA: Diagnosis not present

## 2017-06-22 ENCOUNTER — Ambulatory Visit: Payer: Self-pay | Admitting: Physician Assistant

## 2017-06-22 DIAGNOSIS — W19XXXA Unspecified fall, initial encounter: Secondary | ICD-10-CM | POA: Diagnosis not present

## 2017-06-22 DIAGNOSIS — S42309A Unspecified fracture of shaft of humerus, unspecified arm, initial encounter for closed fracture: Secondary | ICD-10-CM | POA: Diagnosis not present

## 2017-06-22 DIAGNOSIS — M6281 Muscle weakness (generalized): Secondary | ICD-10-CM | POA: Diagnosis not present

## 2017-06-22 DIAGNOSIS — R278 Other lack of coordination: Secondary | ICD-10-CM | POA: Diagnosis not present

## 2017-06-22 DIAGNOSIS — K219 Gastro-esophageal reflux disease without esophagitis: Secondary | ICD-10-CM | POA: Diagnosis not present

## 2017-06-22 DIAGNOSIS — R2689 Other abnormalities of gait and mobility: Secondary | ICD-10-CM | POA: Diagnosis not present

## 2017-06-22 DIAGNOSIS — Z4789 Encounter for other orthopedic aftercare: Secondary | ICD-10-CM | POA: Diagnosis not present

## 2017-06-23 DIAGNOSIS — R278 Other lack of coordination: Secondary | ICD-10-CM | POA: Diagnosis not present

## 2017-06-23 DIAGNOSIS — Z4789 Encounter for other orthopedic aftercare: Secondary | ICD-10-CM | POA: Diagnosis not present

## 2017-06-23 DIAGNOSIS — M6281 Muscle weakness (generalized): Secondary | ICD-10-CM | POA: Diagnosis not present

## 2017-06-23 DIAGNOSIS — R2689 Other abnormalities of gait and mobility: Secondary | ICD-10-CM | POA: Diagnosis not present

## 2017-06-23 DIAGNOSIS — K219 Gastro-esophageal reflux disease without esophagitis: Secondary | ICD-10-CM | POA: Diagnosis not present

## 2017-06-24 DIAGNOSIS — R2689 Other abnormalities of gait and mobility: Secondary | ICD-10-CM | POA: Diagnosis not present

## 2017-06-24 DIAGNOSIS — Z4789 Encounter for other orthopedic aftercare: Secondary | ICD-10-CM | POA: Diagnosis not present

## 2017-06-24 DIAGNOSIS — M6281 Muscle weakness (generalized): Secondary | ICD-10-CM | POA: Diagnosis not present

## 2017-06-24 DIAGNOSIS — R278 Other lack of coordination: Secondary | ICD-10-CM | POA: Diagnosis not present

## 2017-06-24 DIAGNOSIS — K219 Gastro-esophageal reflux disease without esophagitis: Secondary | ICD-10-CM | POA: Diagnosis not present

## 2017-06-25 ENCOUNTER — Telehealth: Payer: Self-pay | Admitting: Physician Assistant

## 2017-06-25 NOTE — Telephone Encounter (Signed)
Called pt, no answer. Left voicemail to call back.

## 2017-06-25 NOTE — Telephone Encounter (Signed)
Called pt, no answer. No voicemail set up.

## 2017-06-27 DIAGNOSIS — K219 Gastro-esophageal reflux disease without esophagitis: Secondary | ICD-10-CM | POA: Diagnosis not present

## 2017-06-27 DIAGNOSIS — M810 Age-related osteoporosis without current pathological fracture: Secondary | ICD-10-CM | POA: Diagnosis not present

## 2017-06-27 DIAGNOSIS — W19XXXD Unspecified fall, subsequent encounter: Secondary | ICD-10-CM | POA: Diagnosis not present

## 2017-06-27 DIAGNOSIS — Z4789 Encounter for other orthopedic aftercare: Secondary | ICD-10-CM | POA: Diagnosis not present

## 2017-06-27 DIAGNOSIS — R2689 Other abnormalities of gait and mobility: Secondary | ICD-10-CM | POA: Diagnosis not present

## 2017-06-27 DIAGNOSIS — S42291D Other displaced fracture of upper end of right humerus, subsequent encounter for fracture with routine healing: Secondary | ICD-10-CM | POA: Diagnosis not present

## 2017-06-27 DIAGNOSIS — M6281 Muscle weakness (generalized): Secondary | ICD-10-CM | POA: Diagnosis not present

## 2017-06-27 DIAGNOSIS — R278 Other lack of coordination: Secondary | ICD-10-CM | POA: Diagnosis not present

## 2017-06-28 DIAGNOSIS — Z4789 Encounter for other orthopedic aftercare: Secondary | ICD-10-CM | POA: Diagnosis not present

## 2017-06-28 DIAGNOSIS — K219 Gastro-esophageal reflux disease without esophagitis: Secondary | ICD-10-CM | POA: Diagnosis not present

## 2017-06-28 DIAGNOSIS — M6281 Muscle weakness (generalized): Secondary | ICD-10-CM | POA: Diagnosis not present

## 2017-06-28 DIAGNOSIS — R278 Other lack of coordination: Secondary | ICD-10-CM | POA: Diagnosis not present

## 2017-06-28 DIAGNOSIS — R2689 Other abnormalities of gait and mobility: Secondary | ICD-10-CM | POA: Diagnosis not present

## 2017-06-29 DIAGNOSIS — R2689 Other abnormalities of gait and mobility: Secondary | ICD-10-CM | POA: Diagnosis not present

## 2017-06-29 DIAGNOSIS — K219 Gastro-esophageal reflux disease without esophagitis: Secondary | ICD-10-CM | POA: Diagnosis not present

## 2017-06-29 DIAGNOSIS — Z4789 Encounter for other orthopedic aftercare: Secondary | ICD-10-CM | POA: Diagnosis not present

## 2017-06-29 DIAGNOSIS — R278 Other lack of coordination: Secondary | ICD-10-CM | POA: Diagnosis not present

## 2017-06-29 DIAGNOSIS — M6281 Muscle weakness (generalized): Secondary | ICD-10-CM | POA: Diagnosis not present

## 2017-06-30 ENCOUNTER — Telehealth: Payer: Self-pay | Admitting: Physician Assistant

## 2017-06-30 DIAGNOSIS — Z4789 Encounter for other orthopedic aftercare: Secondary | ICD-10-CM | POA: Diagnosis not present

## 2017-06-30 DIAGNOSIS — R278 Other lack of coordination: Secondary | ICD-10-CM | POA: Diagnosis not present

## 2017-06-30 DIAGNOSIS — K219 Gastro-esophageal reflux disease without esophagitis: Secondary | ICD-10-CM | POA: Diagnosis not present

## 2017-06-30 DIAGNOSIS — R2689 Other abnormalities of gait and mobility: Secondary | ICD-10-CM | POA: Diagnosis not present

## 2017-06-30 DIAGNOSIS — M6281 Muscle weakness (generalized): Secondary | ICD-10-CM | POA: Diagnosis not present

## 2017-06-30 NOTE — Telephone Encounter (Signed)
Pts rehab director is calling in to inform [place] that Clarise will be released from rehab early & she's faxing over something that needs to be signed for the pt to be able to start her therapy asap,.  Please advise or call [place] @ [phone]

## 2017-07-01 ENCOUNTER — Telehealth: Payer: Self-pay | Admitting: Physician Assistant

## 2017-07-01 DIAGNOSIS — R2689 Other abnormalities of gait and mobility: Secondary | ICD-10-CM | POA: Diagnosis not present

## 2017-07-01 DIAGNOSIS — M6281 Muscle weakness (generalized): Secondary | ICD-10-CM | POA: Diagnosis not present

## 2017-07-01 DIAGNOSIS — Z4789 Encounter for other orthopedic aftercare: Secondary | ICD-10-CM | POA: Diagnosis not present

## 2017-07-01 DIAGNOSIS — R350 Frequency of micturition: Secondary | ICD-10-CM

## 2017-07-01 DIAGNOSIS — R278 Other lack of coordination: Secondary | ICD-10-CM | POA: Diagnosis not present

## 2017-07-01 DIAGNOSIS — K219 Gastro-esophageal reflux disease without esophagitis: Secondary | ICD-10-CM | POA: Diagnosis not present

## 2017-07-01 NOTE — Telephone Encounter (Signed)
Patient's son Asia Dusenbury) called 07/01/17 to request an order for a hospital bed for his mom.  Follow up is requested to call him at [phone].  DPR is current and on file in her chart with OK to speak with Linna Hoff

## 2017-07-04 ENCOUNTER — Telehealth: Payer: Self-pay | Admitting: Physician Assistant

## 2017-07-04 DIAGNOSIS — R2689 Other abnormalities of gait and mobility: Secondary | ICD-10-CM | POA: Diagnosis not present

## 2017-07-04 DIAGNOSIS — S42291D Other displaced fracture of upper end of right humerus, subsequent encounter for fracture with routine healing: Secondary | ICD-10-CM | POA: Diagnosis not present

## 2017-07-04 DIAGNOSIS — K219 Gastro-esophageal reflux disease without esophagitis: Secondary | ICD-10-CM | POA: Diagnosis not present

## 2017-07-04 DIAGNOSIS — M810 Age-related osteoporosis without current pathological fracture: Secondary | ICD-10-CM | POA: Diagnosis not present

## 2017-07-04 DIAGNOSIS — R278 Other lack of coordination: Secondary | ICD-10-CM | POA: Diagnosis not present

## 2017-07-04 DIAGNOSIS — C50911 Malignant neoplasm of unspecified site of right female breast: Secondary | ICD-10-CM | POA: Diagnosis not present

## 2017-07-04 DIAGNOSIS — M6281 Muscle weakness (generalized): Secondary | ICD-10-CM | POA: Diagnosis not present

## 2017-07-04 NOTE — Telephone Encounter (Signed)
Verbal order given. Still waiting on fax to be filled out from [place].

## 2017-07-04 NOTE — Telephone Encounter (Signed)
See note below

## 2017-07-04 NOTE — Telephone Encounter (Signed)
Rebecca Munoz with [employer] says an order was faxed over on September 20th for the patient and this is needed today for home health physical therapy.  It was for Rebecca Munoz to sign off on.  Vikki Ports says a verbal order will work fine, but she is faxing it again.  [phone].

## 2017-07-05 DIAGNOSIS — R262 Difficulty in walking, not elsewhere classified: Secondary | ICD-10-CM | POA: Diagnosis not present

## 2017-07-05 DIAGNOSIS — Z9181 History of falling: Secondary | ICD-10-CM | POA: Diagnosis not present

## 2017-07-05 DIAGNOSIS — M6281 Muscle weakness (generalized): Secondary | ICD-10-CM | POA: Diagnosis not present

## 2017-07-05 DIAGNOSIS — R2681 Unsteadiness on feet: Secondary | ICD-10-CM | POA: Diagnosis not present

## 2017-07-05 DIAGNOSIS — M25511 Pain in right shoulder: Secondary | ICD-10-CM | POA: Diagnosis not present

## 2017-07-05 NOTE — Telephone Encounter (Signed)
Please advise 

## 2017-07-06 DIAGNOSIS — Z9181 History of falling: Secondary | ICD-10-CM | POA: Diagnosis not present

## 2017-07-06 DIAGNOSIS — R2681 Unsteadiness on feet: Secondary | ICD-10-CM | POA: Diagnosis not present

## 2017-07-06 DIAGNOSIS — M25511 Pain in right shoulder: Secondary | ICD-10-CM | POA: Diagnosis not present

## 2017-07-06 DIAGNOSIS — M6281 Muscle weakness (generalized): Secondary | ICD-10-CM | POA: Diagnosis not present

## 2017-07-06 DIAGNOSIS — R262 Difficulty in walking, not elsewhere classified: Secondary | ICD-10-CM | POA: Diagnosis not present

## 2017-07-06 NOTE — Telephone Encounter (Signed)
Called son.  She has moved back to assisted living facility at [facility].  PT is on board.  Has ortho appointment 07/12/17. Son worried about UTI as she is having urinary frequency for the past few days.  I have placed future orders for UA. He wants to hold off on hospital bed order at this time.

## 2017-07-07 DIAGNOSIS — R262 Difficulty in walking, not elsewhere classified: Secondary | ICD-10-CM | POA: Diagnosis not present

## 2017-07-07 DIAGNOSIS — M25511 Pain in right shoulder: Secondary | ICD-10-CM | POA: Diagnosis not present

## 2017-07-07 DIAGNOSIS — Z9181 History of falling: Secondary | ICD-10-CM | POA: Diagnosis not present

## 2017-07-07 DIAGNOSIS — M6281 Muscle weakness (generalized): Secondary | ICD-10-CM | POA: Diagnosis not present

## 2017-07-07 DIAGNOSIS — R2681 Unsteadiness on feet: Secondary | ICD-10-CM | POA: Diagnosis not present

## 2017-07-07 NOTE — Telephone Encounter (Signed)
I have completed this form and placed it in the "to be faxed" box. I have also contacted [facility], no answer. Left voicemail explaining that I have completed the form and am waiting for it to be faxed to them. If she needs anything else, please contact our office.

## 2017-07-08 DIAGNOSIS — R262 Difficulty in walking, not elsewhere classified: Secondary | ICD-10-CM | POA: Diagnosis not present

## 2017-07-08 DIAGNOSIS — R2681 Unsteadiness on feet: Secondary | ICD-10-CM | POA: Diagnosis not present

## 2017-07-08 DIAGNOSIS — Z9181 History of falling: Secondary | ICD-10-CM | POA: Diagnosis not present

## 2017-07-08 DIAGNOSIS — M25511 Pain in right shoulder: Secondary | ICD-10-CM | POA: Diagnosis not present

## 2017-07-08 DIAGNOSIS — M6281 Muscle weakness (generalized): Secondary | ICD-10-CM | POA: Diagnosis not present

## 2017-07-11 DIAGNOSIS — R2681 Unsteadiness on feet: Secondary | ICD-10-CM | POA: Diagnosis not present

## 2017-07-11 DIAGNOSIS — R262 Difficulty in walking, not elsewhere classified: Secondary | ICD-10-CM | POA: Diagnosis not present

## 2017-07-11 DIAGNOSIS — Z9181 History of falling: Secondary | ICD-10-CM | POA: Diagnosis not present

## 2017-07-11 DIAGNOSIS — M6281 Muscle weakness (generalized): Secondary | ICD-10-CM | POA: Diagnosis not present

## 2017-07-11 DIAGNOSIS — M25511 Pain in right shoulder: Secondary | ICD-10-CM | POA: Diagnosis not present

## 2017-07-12 DIAGNOSIS — Z9181 History of falling: Secondary | ICD-10-CM | POA: Diagnosis not present

## 2017-07-12 DIAGNOSIS — R2681 Unsteadiness on feet: Secondary | ICD-10-CM | POA: Diagnosis not present

## 2017-07-12 DIAGNOSIS — M25511 Pain in right shoulder: Secondary | ICD-10-CM | POA: Diagnosis not present

## 2017-07-12 DIAGNOSIS — S42291A Other displaced fracture of upper end of right humerus, initial encounter for closed fracture: Secondary | ICD-10-CM | POA: Diagnosis not present

## 2017-07-12 DIAGNOSIS — R262 Difficulty in walking, not elsewhere classified: Secondary | ICD-10-CM | POA: Diagnosis not present

## 2017-07-12 DIAGNOSIS — M6281 Muscle weakness (generalized): Secondary | ICD-10-CM | POA: Diagnosis not present

## 2017-07-13 DIAGNOSIS — R2681 Unsteadiness on feet: Secondary | ICD-10-CM | POA: Diagnosis not present

## 2017-07-13 DIAGNOSIS — M6281 Muscle weakness (generalized): Secondary | ICD-10-CM | POA: Diagnosis not present

## 2017-07-13 DIAGNOSIS — Z9181 History of falling: Secondary | ICD-10-CM | POA: Diagnosis not present

## 2017-07-13 DIAGNOSIS — R262 Difficulty in walking, not elsewhere classified: Secondary | ICD-10-CM | POA: Diagnosis not present

## 2017-07-13 DIAGNOSIS — M25511 Pain in right shoulder: Secondary | ICD-10-CM | POA: Diagnosis not present

## 2017-07-14 DIAGNOSIS — Z9181 History of falling: Secondary | ICD-10-CM | POA: Diagnosis not present

## 2017-07-14 DIAGNOSIS — M25511 Pain in right shoulder: Secondary | ICD-10-CM | POA: Diagnosis not present

## 2017-07-14 DIAGNOSIS — M6281 Muscle weakness (generalized): Secondary | ICD-10-CM | POA: Diagnosis not present

## 2017-07-14 DIAGNOSIS — R262 Difficulty in walking, not elsewhere classified: Secondary | ICD-10-CM | POA: Diagnosis not present

## 2017-07-14 DIAGNOSIS — R2681 Unsteadiness on feet: Secondary | ICD-10-CM | POA: Diagnosis not present

## 2017-07-15 DIAGNOSIS — M25511 Pain in right shoulder: Secondary | ICD-10-CM | POA: Diagnosis not present

## 2017-07-15 DIAGNOSIS — M6281 Muscle weakness (generalized): Secondary | ICD-10-CM | POA: Diagnosis not present

## 2017-07-15 DIAGNOSIS — R2681 Unsteadiness on feet: Secondary | ICD-10-CM | POA: Diagnosis not present

## 2017-07-15 DIAGNOSIS — R262 Difficulty in walking, not elsewhere classified: Secondary | ICD-10-CM | POA: Diagnosis not present

## 2017-07-15 DIAGNOSIS — Z9181 History of falling: Secondary | ICD-10-CM | POA: Diagnosis not present

## 2017-07-18 DIAGNOSIS — M6281 Muscle weakness (generalized): Secondary | ICD-10-CM | POA: Diagnosis not present

## 2017-07-18 DIAGNOSIS — Z9181 History of falling: Secondary | ICD-10-CM | POA: Diagnosis not present

## 2017-07-18 DIAGNOSIS — R2681 Unsteadiness on feet: Secondary | ICD-10-CM | POA: Diagnosis not present

## 2017-07-18 DIAGNOSIS — R262 Difficulty in walking, not elsewhere classified: Secondary | ICD-10-CM | POA: Diagnosis not present

## 2017-07-18 DIAGNOSIS — M25511 Pain in right shoulder: Secondary | ICD-10-CM | POA: Diagnosis not present

## 2017-07-19 DIAGNOSIS — M25511 Pain in right shoulder: Secondary | ICD-10-CM | POA: Diagnosis not present

## 2017-07-19 DIAGNOSIS — M6281 Muscle weakness (generalized): Secondary | ICD-10-CM | POA: Diagnosis not present

## 2017-07-19 DIAGNOSIS — Z9181 History of falling: Secondary | ICD-10-CM | POA: Diagnosis not present

## 2017-07-19 DIAGNOSIS — R262 Difficulty in walking, not elsewhere classified: Secondary | ICD-10-CM | POA: Diagnosis not present

## 2017-07-19 DIAGNOSIS — R2681 Unsteadiness on feet: Secondary | ICD-10-CM | POA: Diagnosis not present

## 2017-07-20 DIAGNOSIS — R262 Difficulty in walking, not elsewhere classified: Secondary | ICD-10-CM | POA: Diagnosis not present

## 2017-07-20 DIAGNOSIS — R2681 Unsteadiness on feet: Secondary | ICD-10-CM | POA: Diagnosis not present

## 2017-07-20 DIAGNOSIS — M6281 Muscle weakness (generalized): Secondary | ICD-10-CM | POA: Diagnosis not present

## 2017-07-20 DIAGNOSIS — M25511 Pain in right shoulder: Secondary | ICD-10-CM | POA: Diagnosis not present

## 2017-07-20 DIAGNOSIS — Z9181 History of falling: Secondary | ICD-10-CM | POA: Diagnosis not present

## 2017-07-21 DIAGNOSIS — M25511 Pain in right shoulder: Secondary | ICD-10-CM | POA: Diagnosis not present

## 2017-07-21 DIAGNOSIS — M6281 Muscle weakness (generalized): Secondary | ICD-10-CM | POA: Diagnosis not present

## 2017-07-21 DIAGNOSIS — Z9181 History of falling: Secondary | ICD-10-CM | POA: Diagnosis not present

## 2017-07-21 DIAGNOSIS — R262 Difficulty in walking, not elsewhere classified: Secondary | ICD-10-CM | POA: Diagnosis not present

## 2017-07-21 DIAGNOSIS — R2681 Unsteadiness on feet: Secondary | ICD-10-CM | POA: Diagnosis not present

## 2017-07-22 DIAGNOSIS — R262 Difficulty in walking, not elsewhere classified: Secondary | ICD-10-CM | POA: Diagnosis not present

## 2017-07-22 DIAGNOSIS — Z9181 History of falling: Secondary | ICD-10-CM | POA: Diagnosis not present

## 2017-07-22 DIAGNOSIS — M25511 Pain in right shoulder: Secondary | ICD-10-CM | POA: Diagnosis not present

## 2017-07-22 DIAGNOSIS — M6281 Muscle weakness (generalized): Secondary | ICD-10-CM | POA: Diagnosis not present

## 2017-07-22 DIAGNOSIS — R2681 Unsteadiness on feet: Secondary | ICD-10-CM | POA: Diagnosis not present

## 2017-07-25 DIAGNOSIS — M25511 Pain in right shoulder: Secondary | ICD-10-CM | POA: Diagnosis not present

## 2017-07-25 DIAGNOSIS — Z9181 History of falling: Secondary | ICD-10-CM | POA: Diagnosis not present

## 2017-07-25 DIAGNOSIS — R2681 Unsteadiness on feet: Secondary | ICD-10-CM | POA: Diagnosis not present

## 2017-07-25 DIAGNOSIS — R262 Difficulty in walking, not elsewhere classified: Secondary | ICD-10-CM | POA: Diagnosis not present

## 2017-07-25 DIAGNOSIS — M6281 Muscle weakness (generalized): Secondary | ICD-10-CM | POA: Diagnosis not present

## 2017-07-26 DIAGNOSIS — R262 Difficulty in walking, not elsewhere classified: Secondary | ICD-10-CM | POA: Diagnosis not present

## 2017-07-26 DIAGNOSIS — M25511 Pain in right shoulder: Secondary | ICD-10-CM | POA: Diagnosis not present

## 2017-07-26 DIAGNOSIS — R2681 Unsteadiness on feet: Secondary | ICD-10-CM | POA: Diagnosis not present

## 2017-07-26 DIAGNOSIS — M6281 Muscle weakness (generalized): Secondary | ICD-10-CM | POA: Diagnosis not present

## 2017-07-26 DIAGNOSIS — Z9181 History of falling: Secondary | ICD-10-CM | POA: Diagnosis not present

## 2017-07-27 DIAGNOSIS — M6281 Muscle weakness (generalized): Secondary | ICD-10-CM | POA: Diagnosis not present

## 2017-07-27 DIAGNOSIS — Z9181 History of falling: Secondary | ICD-10-CM | POA: Diagnosis not present

## 2017-07-27 DIAGNOSIS — R2681 Unsteadiness on feet: Secondary | ICD-10-CM | POA: Diagnosis not present

## 2017-07-27 DIAGNOSIS — R262 Difficulty in walking, not elsewhere classified: Secondary | ICD-10-CM | POA: Diagnosis not present

## 2017-07-27 DIAGNOSIS — M25511 Pain in right shoulder: Secondary | ICD-10-CM | POA: Diagnosis not present

## 2017-07-28 DIAGNOSIS — R2681 Unsteadiness on feet: Secondary | ICD-10-CM | POA: Diagnosis not present

## 2017-07-28 DIAGNOSIS — M6281 Muscle weakness (generalized): Secondary | ICD-10-CM | POA: Diagnosis not present

## 2017-07-28 DIAGNOSIS — R262 Difficulty in walking, not elsewhere classified: Secondary | ICD-10-CM | POA: Diagnosis not present

## 2017-07-28 DIAGNOSIS — Z9181 History of falling: Secondary | ICD-10-CM | POA: Diagnosis not present

## 2017-07-28 DIAGNOSIS — M25511 Pain in right shoulder: Secondary | ICD-10-CM | POA: Diagnosis not present

## 2017-07-29 DIAGNOSIS — R2681 Unsteadiness on feet: Secondary | ICD-10-CM | POA: Diagnosis not present

## 2017-07-29 DIAGNOSIS — R262 Difficulty in walking, not elsewhere classified: Secondary | ICD-10-CM | POA: Diagnosis not present

## 2017-07-29 DIAGNOSIS — Z9181 History of falling: Secondary | ICD-10-CM | POA: Diagnosis not present

## 2017-07-29 DIAGNOSIS — M6281 Muscle weakness (generalized): Secondary | ICD-10-CM | POA: Diagnosis not present

## 2017-07-29 DIAGNOSIS — M25511 Pain in right shoulder: Secondary | ICD-10-CM | POA: Diagnosis not present

## 2017-08-01 DIAGNOSIS — R2681 Unsteadiness on feet: Secondary | ICD-10-CM | POA: Diagnosis not present

## 2017-08-01 DIAGNOSIS — M25511 Pain in right shoulder: Secondary | ICD-10-CM | POA: Diagnosis not present

## 2017-08-01 DIAGNOSIS — R262 Difficulty in walking, not elsewhere classified: Secondary | ICD-10-CM | POA: Diagnosis not present

## 2017-08-01 DIAGNOSIS — M6281 Muscle weakness (generalized): Secondary | ICD-10-CM | POA: Diagnosis not present

## 2017-08-01 DIAGNOSIS — Z9181 History of falling: Secondary | ICD-10-CM | POA: Diagnosis not present

## 2017-08-02 ENCOUNTER — Telehealth: Payer: Self-pay | Admitting: Physician Assistant

## 2017-08-02 DIAGNOSIS — R2681 Unsteadiness on feet: Secondary | ICD-10-CM | POA: Diagnosis not present

## 2017-08-02 DIAGNOSIS — Z9181 History of falling: Secondary | ICD-10-CM | POA: Diagnosis not present

## 2017-08-02 DIAGNOSIS — S42291D Other displaced fracture of upper end of right humerus, subsequent encounter for fracture with routine healing: Secondary | ICD-10-CM | POA: Diagnosis not present

## 2017-08-02 DIAGNOSIS — M6281 Muscle weakness (generalized): Secondary | ICD-10-CM | POA: Diagnosis not present

## 2017-08-02 DIAGNOSIS — M25511 Pain in right shoulder: Secondary | ICD-10-CM | POA: Diagnosis not present

## 2017-08-02 DIAGNOSIS — R262 Difficulty in walking, not elsewhere classified: Secondary | ICD-10-CM | POA: Diagnosis not present

## 2017-08-02 NOTE — Telephone Encounter (Signed)
Chelsea from [employer] called about plan of care and orders she said she has faxed several times for PT and OT for pt. She said she needs the provider to sign them and fax them back to [phone]. Please advise. Thanks!

## 2017-08-03 DIAGNOSIS — M25511 Pain in right shoulder: Secondary | ICD-10-CM | POA: Diagnosis not present

## 2017-08-03 DIAGNOSIS — Z9181 History of falling: Secondary | ICD-10-CM | POA: Diagnosis not present

## 2017-08-03 DIAGNOSIS — R2681 Unsteadiness on feet: Secondary | ICD-10-CM | POA: Diagnosis not present

## 2017-08-03 DIAGNOSIS — R262 Difficulty in walking, not elsewhere classified: Secondary | ICD-10-CM | POA: Diagnosis not present

## 2017-08-03 DIAGNOSIS — M6281 Muscle weakness (generalized): Secondary | ICD-10-CM | POA: Diagnosis not present

## 2017-08-03 NOTE — Telephone Encounter (Signed)
See message below °

## 2017-08-03 NOTE — Telephone Encounter (Signed)
Please see my encounter from 06/30/17: "I have completed this form and placed it in the "to be faxed" box. I have also contacted [facility], no answer. Left voicemail explaining that I have completed the form and am waiting for it to be faxed to them. If she needs anything else, please contact our office."   I am unsure as to why they did not receive the forms I placed in the "To be faxed" box. Please let me know if there is anything else I can do.

## 2017-08-04 DIAGNOSIS — Z9181 History of falling: Secondary | ICD-10-CM | POA: Diagnosis not present

## 2017-08-04 DIAGNOSIS — M25511 Pain in right shoulder: Secondary | ICD-10-CM | POA: Diagnosis not present

## 2017-08-04 DIAGNOSIS — M6281 Muscle weakness (generalized): Secondary | ICD-10-CM | POA: Diagnosis not present

## 2017-08-04 DIAGNOSIS — R2681 Unsteadiness on feet: Secondary | ICD-10-CM | POA: Diagnosis not present

## 2017-08-04 DIAGNOSIS — R262 Difficulty in walking, not elsewhere classified: Secondary | ICD-10-CM | POA: Diagnosis not present

## 2017-08-04 NOTE — Telephone Encounter (Signed)
Received  "new" in box today. I faxed them and put in the scan box at 104.

## 2017-08-05 DIAGNOSIS — R262 Difficulty in walking, not elsewhere classified: Secondary | ICD-10-CM | POA: Diagnosis not present

## 2017-08-05 DIAGNOSIS — M6281 Muscle weakness (generalized): Secondary | ICD-10-CM | POA: Diagnosis not present

## 2017-08-05 DIAGNOSIS — Z9181 History of falling: Secondary | ICD-10-CM | POA: Diagnosis not present

## 2017-08-05 DIAGNOSIS — M25511 Pain in right shoulder: Secondary | ICD-10-CM | POA: Diagnosis not present

## 2017-08-05 DIAGNOSIS — R2681 Unsteadiness on feet: Secondary | ICD-10-CM | POA: Diagnosis not present

## 2017-08-08 DIAGNOSIS — R262 Difficulty in walking, not elsewhere classified: Secondary | ICD-10-CM | POA: Diagnosis not present

## 2017-08-08 DIAGNOSIS — Z9181 History of falling: Secondary | ICD-10-CM | POA: Diagnosis not present

## 2017-08-08 DIAGNOSIS — M6281 Muscle weakness (generalized): Secondary | ICD-10-CM | POA: Diagnosis not present

## 2017-08-08 DIAGNOSIS — R2681 Unsteadiness on feet: Secondary | ICD-10-CM | POA: Diagnosis not present

## 2017-08-08 DIAGNOSIS — M25511 Pain in right shoulder: Secondary | ICD-10-CM | POA: Diagnosis not present

## 2017-08-09 DIAGNOSIS — R262 Difficulty in walking, not elsewhere classified: Secondary | ICD-10-CM | POA: Diagnosis not present

## 2017-08-09 DIAGNOSIS — M6281 Muscle weakness (generalized): Secondary | ICD-10-CM | POA: Diagnosis not present

## 2017-08-09 DIAGNOSIS — Z9181 History of falling: Secondary | ICD-10-CM | POA: Diagnosis not present

## 2017-08-09 DIAGNOSIS — M25511 Pain in right shoulder: Secondary | ICD-10-CM | POA: Diagnosis not present

## 2017-08-09 DIAGNOSIS — R2681 Unsteadiness on feet: Secondary | ICD-10-CM | POA: Diagnosis not present

## 2017-08-10 DIAGNOSIS — Z9181 History of falling: Secondary | ICD-10-CM | POA: Diagnosis not present

## 2017-08-10 DIAGNOSIS — M25511 Pain in right shoulder: Secondary | ICD-10-CM | POA: Diagnosis not present

## 2017-08-10 DIAGNOSIS — M6281 Muscle weakness (generalized): Secondary | ICD-10-CM | POA: Diagnosis not present

## 2017-08-10 DIAGNOSIS — R262 Difficulty in walking, not elsewhere classified: Secondary | ICD-10-CM | POA: Diagnosis not present

## 2017-08-10 DIAGNOSIS — R2681 Unsteadiness on feet: Secondary | ICD-10-CM | POA: Diagnosis not present

## 2017-08-11 DIAGNOSIS — M25511 Pain in right shoulder: Secondary | ICD-10-CM | POA: Diagnosis not present

## 2017-08-11 DIAGNOSIS — Z9181 History of falling: Secondary | ICD-10-CM | POA: Diagnosis not present

## 2017-08-11 DIAGNOSIS — R2681 Unsteadiness on feet: Secondary | ICD-10-CM | POA: Diagnosis not present

## 2017-08-11 DIAGNOSIS — R262 Difficulty in walking, not elsewhere classified: Secondary | ICD-10-CM | POA: Diagnosis not present

## 2017-08-11 DIAGNOSIS — M6281 Muscle weakness (generalized): Secondary | ICD-10-CM | POA: Diagnosis not present

## 2017-08-12 DIAGNOSIS — Z9181 History of falling: Secondary | ICD-10-CM | POA: Diagnosis not present

## 2017-08-12 DIAGNOSIS — R2681 Unsteadiness on feet: Secondary | ICD-10-CM | POA: Diagnosis not present

## 2017-08-12 DIAGNOSIS — M25511 Pain in right shoulder: Secondary | ICD-10-CM | POA: Diagnosis not present

## 2017-08-12 DIAGNOSIS — R262 Difficulty in walking, not elsewhere classified: Secondary | ICD-10-CM | POA: Diagnosis not present

## 2017-08-12 DIAGNOSIS — M6281 Muscle weakness (generalized): Secondary | ICD-10-CM | POA: Diagnosis not present

## 2017-08-15 DIAGNOSIS — R2681 Unsteadiness on feet: Secondary | ICD-10-CM | POA: Diagnosis not present

## 2017-08-15 DIAGNOSIS — M6281 Muscle weakness (generalized): Secondary | ICD-10-CM | POA: Diagnosis not present

## 2017-08-15 DIAGNOSIS — M25511 Pain in right shoulder: Secondary | ICD-10-CM | POA: Diagnosis not present

## 2017-08-15 DIAGNOSIS — Z9181 History of falling: Secondary | ICD-10-CM | POA: Diagnosis not present

## 2017-08-15 DIAGNOSIS — R262 Difficulty in walking, not elsewhere classified: Secondary | ICD-10-CM | POA: Diagnosis not present

## 2017-08-16 DIAGNOSIS — M6281 Muscle weakness (generalized): Secondary | ICD-10-CM | POA: Diagnosis not present

## 2017-08-16 DIAGNOSIS — R2681 Unsteadiness on feet: Secondary | ICD-10-CM | POA: Diagnosis not present

## 2017-08-16 DIAGNOSIS — R262 Difficulty in walking, not elsewhere classified: Secondary | ICD-10-CM | POA: Diagnosis not present

## 2017-08-16 DIAGNOSIS — Z9181 History of falling: Secondary | ICD-10-CM | POA: Diagnosis not present

## 2017-08-16 DIAGNOSIS — M25511 Pain in right shoulder: Secondary | ICD-10-CM | POA: Diagnosis not present

## 2017-08-17 DIAGNOSIS — M6281 Muscle weakness (generalized): Secondary | ICD-10-CM | POA: Diagnosis not present

## 2017-08-17 DIAGNOSIS — R262 Difficulty in walking, not elsewhere classified: Secondary | ICD-10-CM | POA: Diagnosis not present

## 2017-08-17 DIAGNOSIS — M25511 Pain in right shoulder: Secondary | ICD-10-CM | POA: Diagnosis not present

## 2017-08-17 DIAGNOSIS — Z9181 History of falling: Secondary | ICD-10-CM | POA: Diagnosis not present

## 2017-08-17 DIAGNOSIS — R2681 Unsteadiness on feet: Secondary | ICD-10-CM | POA: Diagnosis not present

## 2017-08-18 DIAGNOSIS — M6281 Muscle weakness (generalized): Secondary | ICD-10-CM | POA: Diagnosis not present

## 2017-08-18 DIAGNOSIS — M25511 Pain in right shoulder: Secondary | ICD-10-CM | POA: Diagnosis not present

## 2017-08-18 DIAGNOSIS — Z9181 History of falling: Secondary | ICD-10-CM | POA: Diagnosis not present

## 2017-08-18 DIAGNOSIS — R262 Difficulty in walking, not elsewhere classified: Secondary | ICD-10-CM | POA: Diagnosis not present

## 2017-08-18 DIAGNOSIS — R2681 Unsteadiness on feet: Secondary | ICD-10-CM | POA: Diagnosis not present

## 2017-08-19 DIAGNOSIS — R262 Difficulty in walking, not elsewhere classified: Secondary | ICD-10-CM | POA: Diagnosis not present

## 2017-08-19 DIAGNOSIS — M25511 Pain in right shoulder: Secondary | ICD-10-CM | POA: Diagnosis not present

## 2017-08-19 DIAGNOSIS — M6281 Muscle weakness (generalized): Secondary | ICD-10-CM | POA: Diagnosis not present

## 2017-08-19 DIAGNOSIS — Z9181 History of falling: Secondary | ICD-10-CM | POA: Diagnosis not present

## 2017-08-19 DIAGNOSIS — R2681 Unsteadiness on feet: Secondary | ICD-10-CM | POA: Diagnosis not present

## 2017-08-22 DIAGNOSIS — M25511 Pain in right shoulder: Secondary | ICD-10-CM | POA: Diagnosis not present

## 2017-08-22 DIAGNOSIS — M6281 Muscle weakness (generalized): Secondary | ICD-10-CM | POA: Diagnosis not present

## 2017-08-22 DIAGNOSIS — R2681 Unsteadiness on feet: Secondary | ICD-10-CM | POA: Diagnosis not present

## 2017-08-22 DIAGNOSIS — R262 Difficulty in walking, not elsewhere classified: Secondary | ICD-10-CM | POA: Diagnosis not present

## 2017-08-22 DIAGNOSIS — Z9181 History of falling: Secondary | ICD-10-CM | POA: Diagnosis not present

## 2017-08-23 DIAGNOSIS — M6281 Muscle weakness (generalized): Secondary | ICD-10-CM | POA: Diagnosis not present

## 2017-08-23 DIAGNOSIS — M25511 Pain in right shoulder: Secondary | ICD-10-CM | POA: Diagnosis not present

## 2017-08-23 DIAGNOSIS — Z9181 History of falling: Secondary | ICD-10-CM | POA: Diagnosis not present

## 2017-08-23 DIAGNOSIS — R2681 Unsteadiness on feet: Secondary | ICD-10-CM | POA: Diagnosis not present

## 2017-08-23 DIAGNOSIS — R262 Difficulty in walking, not elsewhere classified: Secondary | ICD-10-CM | POA: Diagnosis not present

## 2017-08-24 DIAGNOSIS — M25511 Pain in right shoulder: Secondary | ICD-10-CM | POA: Diagnosis not present

## 2017-08-24 DIAGNOSIS — Z9181 History of falling: Secondary | ICD-10-CM | POA: Diagnosis not present

## 2017-08-24 DIAGNOSIS — R2681 Unsteadiness on feet: Secondary | ICD-10-CM | POA: Diagnosis not present

## 2017-08-24 DIAGNOSIS — M6281 Muscle weakness (generalized): Secondary | ICD-10-CM | POA: Diagnosis not present

## 2017-08-24 DIAGNOSIS — R262 Difficulty in walking, not elsewhere classified: Secondary | ICD-10-CM | POA: Diagnosis not present

## 2017-08-25 DIAGNOSIS — M25511 Pain in right shoulder: Secondary | ICD-10-CM | POA: Diagnosis not present

## 2017-08-25 DIAGNOSIS — R2681 Unsteadiness on feet: Secondary | ICD-10-CM | POA: Diagnosis not present

## 2017-08-25 DIAGNOSIS — R262 Difficulty in walking, not elsewhere classified: Secondary | ICD-10-CM | POA: Diagnosis not present

## 2017-08-25 DIAGNOSIS — Z9181 History of falling: Secondary | ICD-10-CM | POA: Diagnosis not present

## 2017-08-25 DIAGNOSIS — M6281 Muscle weakness (generalized): Secondary | ICD-10-CM | POA: Diagnosis not present

## 2017-08-26 DIAGNOSIS — R2681 Unsteadiness on feet: Secondary | ICD-10-CM | POA: Diagnosis not present

## 2017-08-26 DIAGNOSIS — R262 Difficulty in walking, not elsewhere classified: Secondary | ICD-10-CM | POA: Diagnosis not present

## 2017-08-26 DIAGNOSIS — M25511 Pain in right shoulder: Secondary | ICD-10-CM | POA: Diagnosis not present

## 2017-08-26 DIAGNOSIS — Z9181 History of falling: Secondary | ICD-10-CM | POA: Diagnosis not present

## 2017-08-26 DIAGNOSIS — M6281 Muscle weakness (generalized): Secondary | ICD-10-CM | POA: Diagnosis not present

## 2017-08-30 DIAGNOSIS — M6281 Muscle weakness (generalized): Secondary | ICD-10-CM | POA: Diagnosis not present

## 2017-08-30 DIAGNOSIS — R2681 Unsteadiness on feet: Secondary | ICD-10-CM | POA: Diagnosis not present

## 2017-08-30 DIAGNOSIS — M25511 Pain in right shoulder: Secondary | ICD-10-CM | POA: Diagnosis not present

## 2017-08-30 DIAGNOSIS — Z9181 History of falling: Secondary | ICD-10-CM | POA: Diagnosis not present

## 2017-08-30 DIAGNOSIS — R262 Difficulty in walking, not elsewhere classified: Secondary | ICD-10-CM | POA: Diagnosis not present

## 2017-08-31 DIAGNOSIS — Z9181 History of falling: Secondary | ICD-10-CM | POA: Diagnosis not present

## 2017-08-31 DIAGNOSIS — R262 Difficulty in walking, not elsewhere classified: Secondary | ICD-10-CM | POA: Diagnosis not present

## 2017-08-31 DIAGNOSIS — R2681 Unsteadiness on feet: Secondary | ICD-10-CM | POA: Diagnosis not present

## 2017-08-31 DIAGNOSIS — M6281 Muscle weakness (generalized): Secondary | ICD-10-CM | POA: Diagnosis not present

## 2017-08-31 DIAGNOSIS — M25511 Pain in right shoulder: Secondary | ICD-10-CM | POA: Diagnosis not present

## 2017-09-06 ENCOUNTER — Telehealth: Payer: Self-pay

## 2017-09-06 DIAGNOSIS — R2681 Unsteadiness on feet: Secondary | ICD-10-CM | POA: Diagnosis not present

## 2017-09-06 DIAGNOSIS — Z9181 History of falling: Secondary | ICD-10-CM | POA: Diagnosis not present

## 2017-09-06 DIAGNOSIS — M25511 Pain in right shoulder: Secondary | ICD-10-CM | POA: Diagnosis not present

## 2017-09-06 DIAGNOSIS — R262 Difficulty in walking, not elsewhere classified: Secondary | ICD-10-CM | POA: Diagnosis not present

## 2017-09-06 DIAGNOSIS — M6281 Muscle weakness (generalized): Secondary | ICD-10-CM | POA: Diagnosis not present

## 2017-09-06 NOTE — Telephone Encounter (Signed)
Have you received this paperwork?  Copied from [place]. Topic: General - Other >> Sep 06, 2017  8:30 AM Chauncey Mann A wrote: Reason for CRM: Vikki Ports from [facility] calling regarding paper work she faxed over for [employer]. Chelsea faxed Physical Therapy Plan of Care 9/27 and again on 10/16 and Occupational Plan of Care 9/27. However she has yet to receive a signed version of either of the PT Plan of Care of the OT Plan of care. Please sign and fax these back ASAP.  Vikki Ports will also send over two new Plan of Care documents to be signed and faxed back to you.   Vikki Ports will fax again to [place].   Please fax these back to [facility] at  [phone]  If need you can contact [facility] at [phone] (fax number and phone number are the same)

## 2017-09-07 NOTE — Telephone Encounter (Signed)
PT/OT forms signed by provider.   Phone call to [facility], no answer. Will attempt call again later.

## 2017-09-07 NOTE — Telephone Encounter (Signed)
Phone call to [facility]. Left message stating Rudene Christians from [facility] office is calling regarding paperwork. Paperwork for patient PT/OT faxed again. Of note, per provider, paperwork has been faxed multiple times.

## 2017-09-08 ENCOUNTER — Telehealth: Payer: Self-pay

## 2017-09-08 DIAGNOSIS — H9190 Unspecified hearing loss, unspecified ear: Secondary | ICD-10-CM

## 2017-09-08 NOTE — Telephone Encounter (Signed)
Copied from [place] [id number]. Topic: Inquiry >> Sep 08, 2017 12:44 PM Patrice Paradise wrote: Reason for CRM: Pt son called and would like for Rebecca Munoz to give him a call @ [phone]. The reason for the call back is because his mother wears hearing aids and it's time for her hearing evaluation and for insurance purpose it requests a referral.

## 2017-09-08 NOTE — Telephone Encounter (Signed)
° °  Reason for call:  Vikki Ports wanted to inform paperwork was received and she really appreciate you ladies faxing forms several times.

## 2017-09-09 DIAGNOSIS — M6281 Muscle weakness (generalized): Secondary | ICD-10-CM | POA: Diagnosis not present

## 2017-09-09 DIAGNOSIS — R2681 Unsteadiness on feet: Secondary | ICD-10-CM | POA: Diagnosis not present

## 2017-09-09 DIAGNOSIS — Z9181 History of falling: Secondary | ICD-10-CM | POA: Diagnosis not present

## 2017-09-09 DIAGNOSIS — R262 Difficulty in walking, not elsewhere classified: Secondary | ICD-10-CM | POA: Diagnosis not present

## 2017-09-09 DIAGNOSIS — M25511 Pain in right shoulder: Secondary | ICD-10-CM | POA: Diagnosis not present

## 2017-09-12 ENCOUNTER — Telehealth: Payer: Self-pay | Admitting: Physician Assistant

## 2017-09-12 DIAGNOSIS — M79601 Pain in right arm: Secondary | ICD-10-CM | POA: Diagnosis not present

## 2017-09-12 DIAGNOSIS — M25511 Pain in right shoulder: Secondary | ICD-10-CM | POA: Diagnosis not present

## 2017-09-12 DIAGNOSIS — M6281 Muscle weakness (generalized): Secondary | ICD-10-CM | POA: Diagnosis not present

## 2017-09-12 DIAGNOSIS — M25531 Pain in right wrist: Secondary | ICD-10-CM | POA: Diagnosis not present

## 2017-09-12 NOTE — Addendum Note (Signed)
Addended by: Tenna Delaine D on: 09/12/2017 01:45 PM   Modules accepted: Orders

## 2017-09-12 NOTE — Telephone Encounter (Signed)
Copied from [place] #[id number]. Topic: Referral - Request >> Sep 12, 2017  9:01 AM Cecelia Byars, NT wrote: Reason for CRM: Patients son Lindley Stachnik called again to follow up with request for referral to [facility] , [phone]  please him at [phone] as soon as possible with an update

## 2017-09-12 NOTE — Telephone Encounter (Signed)
Orders Placed This Encounter  Procedures  . Ambulatory referral to Audiology    Referral Priority:   Routine    Referral Type:   Audiology Exam    Referral Reason:   Specialty Services Required    Number of Visits Requested:   1  Returned patient's call.  Patient son would like me to make a referral for audiology at this time.  His mother wears hearing aids and it is time for her hearing evaluation.  However, her insurance requires that she have a referral placed to go back to her audiologist.

## 2017-09-13 DIAGNOSIS — M79601 Pain in right arm: Secondary | ICD-10-CM | POA: Diagnosis not present

## 2017-09-13 DIAGNOSIS — M25511 Pain in right shoulder: Secondary | ICD-10-CM | POA: Diagnosis not present

## 2017-09-13 DIAGNOSIS — M6281 Muscle weakness (generalized): Secondary | ICD-10-CM | POA: Diagnosis not present

## 2017-09-13 DIAGNOSIS — M25531 Pain in right wrist: Secondary | ICD-10-CM | POA: Diagnosis not present

## 2017-09-15 ENCOUNTER — Other Ambulatory Visit: Payer: Self-pay

## 2017-09-15 ENCOUNTER — Ambulatory Visit (INDEPENDENT_AMBULATORY_CARE_PROVIDER_SITE_OTHER): Payer: Medicare Other | Admitting: Physician Assistant

## 2017-09-15 ENCOUNTER — Encounter: Payer: Self-pay | Admitting: Physician Assistant

## 2017-09-15 VITALS — BP 134/60 | HR 60 | Temp 98.0°F | Resp 16 | Ht 65.51 in | Wt 128.4 lb

## 2017-09-15 DIAGNOSIS — S42211S Unspecified displaced fracture of surgical neck of right humerus, sequela: Secondary | ICD-10-CM

## 2017-09-15 DIAGNOSIS — Z0130 Encounter for examination of blood pressure without abnormal findings: Secondary | ICD-10-CM | POA: Diagnosis not present

## 2017-09-15 DIAGNOSIS — M79601 Pain in right arm: Secondary | ICD-10-CM | POA: Diagnosis not present

## 2017-09-15 DIAGNOSIS — M6281 Muscle weakness (generalized): Secondary | ICD-10-CM | POA: Diagnosis not present

## 2017-09-15 DIAGNOSIS — M25511 Pain in right shoulder: Secondary | ICD-10-CM | POA: Diagnosis not present

## 2017-09-15 DIAGNOSIS — M25531 Pain in right wrist: Secondary | ICD-10-CM | POA: Diagnosis not present

## 2017-09-15 NOTE — Patient Instructions (Addendum)
It was great seeing you today.  I recommend that you follow-up with your orthopedic surgeon.  In terms of physical therapy, I would like you to continue as long as her blood pressure remains at a normal value.  I want you to increase your water consumption.  Make sure you drink at least 2 glasses of water before physical therapy.  Please let me know if you develop any chest pain, heart palpitations, lightheadedness, dizziness, or passing out episodes.  In terms of medication, you can use the Prilosec and Carafate as needed.  You do not have to use it daily.  It is unlikely that these would affect her blood pressure.  Hypotension As your heart beats, it forces blood through your body. This force is called blood pressure. If you have hypotension, you have low blood pressure. When your blood pressure is too low, you may not get enough blood to your brain. You may feel weak, feel light-headed, have a fast heartbeat, or even pass out (faint). Follow these instructions at home: Eating and drinking  Drink enough fluids to keep your pee (urine) clear or pale yellow.  Eat a healthy diet, and follow instructions from your doctor about eating or drinking restrictions. A healthy diet includes: ? Fresh fruits and vegetables. ? Whole grains. ? Low-fat (lean) meats. ? Low-fat dairy products.  Eat extra salt only as told. Do not add extra salt to your diet unless your doctor tells you to.  Eat small meals often.  Avoid standing up quickly after you eat. Medicines  Take over-the-counter and prescription medicines only as told by your doctor. ? Follow instructions from your doctor about changing how much you take (the dosage) of your medicines, if this applies. ? Do not stop or change your medicine on your own. General instructions  Wear compression stockings as told by your doctor.  Get up slowly from lying down or sitting.  Avoid hot showers and a lot of heat as told by your doctor.  Return to  your normal activities as told by your doctor. Ask what activities are safe for you.  Do not use any products that contain nicotine or tobacco, such as cigarettes and e-cigarettes. If you need help quitting, ask your doctor.  Keep all follow-up visits as told by your doctor. This is important. Contact a doctor if:  You throw up (vomit).  You have watery poop (diarrhea).  You have a fever for more than 2-3 days.  You feel more thirsty than normal.  You feel weak and tired. Get help right away if:  You have chest pain.  You have a fast or irregular heartbeat.  You lose feeling (get numbness) in any part of your body.  You cannot move your arms or your legs.  You have trouble talking.  You get sweaty or feel light-headed.  You faint.  You have trouble breathing.  You have trouble staying awake.  You feel confused. This information is not intended to replace advice given to you by your health care provider. Make sure you discuss any questions you have with your health care provider. Document Released: 12/22/2009 Document Revised: 06/15/2016 Document Reviewed: 06/15/2016 Elsevier Interactive Patient Education  2017 [employer].   IF you received an x-ray today, you will receive an invoice from [facility]. Please contact [facility] at [phone] with questions or concerns regarding your invoice.   IF you received labwork today, you will receive an invoice from [facility]. Please contact [facility] at [phone] with  questions or concerns regarding your invoice.   Our billing staff will not be able to assist you with questions regarding bills from these companies.  You will be contacted with the lab results as soon as they are available. The fastest way to get your results is to activate your My Chart account. Instructions are located on the last page of this paperwork. If you have not heard from [place] regarding the results in 2 weeks, please contact this  office.

## 2017-09-15 NOTE — Progress Notes (Signed)
Rebecca Munoz  MRN: [id number] DOB: Jan 13, 1921  Subjective:  Rebecca Munoz is a 83 y.o. female seen in office today for a chief complaint of follow up on blood pressure readings. Of note, pt had a closed displaced fracture of surgical neck of right humerus on 06/17/17.  She has been followed by orthopedic surgery.  They have discussed surgery as an option but recommend patient do PT first.  Her physical therapist is coming to her living facility.  She has had some reported low blood pressure readings during PT sessions.  Has had documented diastolic readings in the 60Y, and one at 47.  Systolic range has remained in the 100s.  She is asymptomatic during the sessions.  Denies heart palpitations, dizziness, lightheadedness, chest pain, shortness of breath, and syncope.  She is only drinking 3-4 glasses of water a day. She is not eating a lot of salt.   Review of Systems  Constitutional: Negative for chills, diaphoresis, fatigue, fever and unexpected weight change.  Respiratory: Negative for cough.     There are no active problems to display for this patient.   Current Outpatient Medications on File Prior to Visit  Medication Sig Dispense Refill  . acetaminophen (TYLENOL) 500 MG tablet Take 1 tablet (500 mg total) by mouth every 6 (six) hours as needed. 30 tablet 0  . aspirin 81 MG tablet Take 81 mg by mouth daily.    Marland Kitchen CALCIUM PO Take 1 tablet by mouth daily.    Marland Kitchen loratadine (CLARITIN) 10 MG tablet Take 1 tablet (10 mg total) by mouth daily. 90 tablet 0  . Melatonin-Pyridoxine (MELATIN PO) Take by mouth at bedtime as needed.    . Multiple Vitamins-Minerals (MULTIVITAMIN WITH MINERALS) tablet Take 1 tablet by mouth daily.    Marland Kitchen omeprazole (PRILOSEC) 20 MG capsule Take 1 capsule (20 mg total) by mouth daily as needed. (Patient taking differently: Take 20 mg by mouth daily as needed (reflux). ) 30 capsule 0  . oxyCODONE-acetaminophen (PERCOCET/ROXICET) 5-325 MG tablet Take 1 tablet by mouth  every 4 (four) hours as needed for severe pain. 15 tablet 0  . sucralfate (CARAFATE) 1 g tablet Take 1 tablet (1 g total) by mouth daily with breakfast. 60 tablet 0  . TURMERIC PO Take by mouth daily.     No current facility-administered medications on file prior to visit.     No Known Allergies   Objective:  BP 134/60 (BP Location: Left Arm, Patient Position: Sitting, Cuff Size: Normal)   Pulse 60   Temp 98 F (36.7 C) (Oral)   Resp 16   Ht 5' 5.51" (1.664 m)   Wt 128 lb 6.4 oz (58.2 kg)   SpO2 98%   BMI 21.03 kg/m   Physical Exam  Constitutional: She is oriented to person, place, and time and well-developed, well-nourished, and in no distress.  HENT:  Head: Normocephalic and atraumatic.  Eyes: Conjunctivae are normal.  Neck: Normal range of motion.  Cardiovascular: Normal rate, regular rhythm and normal heart sounds.  Pulmonary/Chest: Effort normal.  Musculoskeletal:       Right shoulder: She exhibits decreased range of motion.  Neurological: She is alert and oriented to person, place, and time. Gait normal.  Skin: Skin is warm and dry.  Psychiatric: Affect normal.  Vitals reviewed.   BP Readings from Last 3 Encounters:  09/15/17 134/60  06/18/17 135/68  05/25/17 100/64   Orthostatic VS for the past 24 hrs:  BP- Lying Pulse- Lying BP- Sitting Pulse-  Sitting  09/15/17 1147 135/70 61 137/73 65      Assessment and Plan :  1. Blood pressure check 2. Closed displaced fracture of surgical neck of right humerus, unspecified fracture morphology, sequela Blood pressure well controlled in the office.  Orthostatic vital signs normal.  She is asymptomatic.  Given patient blood pressure parameters to give her physical therapist for her sessions.  Recommend she stay above 100/60 during sessions.  Encouraged patient to make sure she is drinking at least 40-60 ounces of water a day.  Also educated patient that she can increase her salt intake.  Advised to contact our office if  she develops any concerning symptoms such as chest pain, heart physicians, lightheadedness, dizziness, or syncope.  Encouraged to follow-up with orthopedic surgery as planned.  Follow-up here as needed. - Orthostatic vital signs  Tenna Delaine PA-C  [facility] [place] 09/15/2017 11:09 AM

## 2017-09-16 ENCOUNTER — Encounter: Payer: Self-pay | Admitting: Physician Assistant

## 2017-09-20 DIAGNOSIS — M25531 Pain in right wrist: Secondary | ICD-10-CM | POA: Diagnosis not present

## 2017-09-20 DIAGNOSIS — M79601 Pain in right arm: Secondary | ICD-10-CM | POA: Diagnosis not present

## 2017-09-20 DIAGNOSIS — M6281 Muscle weakness (generalized): Secondary | ICD-10-CM | POA: Diagnosis not present

## 2017-09-20 DIAGNOSIS — M25511 Pain in right shoulder: Secondary | ICD-10-CM | POA: Diagnosis not present

## 2017-09-22 DIAGNOSIS — M25511 Pain in right shoulder: Secondary | ICD-10-CM | POA: Diagnosis not present

## 2017-09-22 DIAGNOSIS — M79601 Pain in right arm: Secondary | ICD-10-CM | POA: Diagnosis not present

## 2017-09-22 DIAGNOSIS — M6281 Muscle weakness (generalized): Secondary | ICD-10-CM | POA: Diagnosis not present

## 2017-09-22 DIAGNOSIS — M25531 Pain in right wrist: Secondary | ICD-10-CM | POA: Diagnosis not present

## 2017-09-23 DIAGNOSIS — M25531 Pain in right wrist: Secondary | ICD-10-CM | POA: Diagnosis not present

## 2017-09-23 DIAGNOSIS — M79601 Pain in right arm: Secondary | ICD-10-CM | POA: Diagnosis not present

## 2017-09-23 DIAGNOSIS — M6281 Muscle weakness (generalized): Secondary | ICD-10-CM | POA: Diagnosis not present

## 2017-09-23 DIAGNOSIS — M25511 Pain in right shoulder: Secondary | ICD-10-CM | POA: Diagnosis not present

## 2017-09-26 DIAGNOSIS — M6281 Muscle weakness (generalized): Secondary | ICD-10-CM | POA: Diagnosis not present

## 2017-09-26 DIAGNOSIS — M79601 Pain in right arm: Secondary | ICD-10-CM | POA: Diagnosis not present

## 2017-09-26 DIAGNOSIS — M25531 Pain in right wrist: Secondary | ICD-10-CM | POA: Diagnosis not present

## 2017-09-26 DIAGNOSIS — M25511 Pain in right shoulder: Secondary | ICD-10-CM | POA: Diagnosis not present

## 2017-09-27 DIAGNOSIS — M6281 Muscle weakness (generalized): Secondary | ICD-10-CM | POA: Diagnosis not present

## 2017-09-27 DIAGNOSIS — M79601 Pain in right arm: Secondary | ICD-10-CM | POA: Diagnosis not present

## 2017-09-27 DIAGNOSIS — M25511 Pain in right shoulder: Secondary | ICD-10-CM | POA: Diagnosis not present

## 2017-09-27 DIAGNOSIS — M25531 Pain in right wrist: Secondary | ICD-10-CM | POA: Diagnosis not present

## 2017-09-28 NOTE — Telephone Encounter (Signed)
Copied from [place]. Topic: General - Other >> Sep 27, 2017  3:58 PM Marin Olp L wrote: Reason for CRM: Claiborne Billings, OT w/ [facility] is calling to report that patient has tried drinking water and resting but her bp is still 105/53 and wants to know if PA Timmothy Euler still wants her to refrain from doing OT when diastolic is below 60? Also whether or not Timmothy Euler wants a call when it is below 53? Please call back to advise.

## 2017-09-29 ENCOUNTER — Other Ambulatory Visit: Payer: Self-pay | Admitting: Physician Assistant

## 2017-09-29 DIAGNOSIS — M25531 Pain in right wrist: Secondary | ICD-10-CM | POA: Diagnosis not present

## 2017-09-29 DIAGNOSIS — M25511 Pain in right shoulder: Secondary | ICD-10-CM | POA: Diagnosis not present

## 2017-09-29 DIAGNOSIS — M79601 Pain in right arm: Secondary | ICD-10-CM | POA: Diagnosis not present

## 2017-09-29 DIAGNOSIS — M6281 Muscle weakness (generalized): Secondary | ICD-10-CM | POA: Diagnosis not present

## 2017-09-29 DIAGNOSIS — R1013 Epigastric pain: Secondary | ICD-10-CM

## 2017-09-29 DIAGNOSIS — J309 Allergic rhinitis, unspecified: Secondary | ICD-10-CM

## 2017-09-29 NOTE — Telephone Encounter (Signed)
Please call patient.  I do recommend that she refrain from OT if her diastolic blood pressure drops below 60.  She does not have to call the office every time this happens.  If this continues to be an issue because her blood pressure is remaining low, please let her notify the office at that point.  And in terms of audiology referral.  It looks like it was sent to [facility] see audiology on 12/14.  They should be hearing something in the next few days to schedule his appointment.

## 2017-09-29 NOTE — Telephone Encounter (Signed)
Called patient's son (on her 45) to let him know appointment was made for 12/27/17 at 9:00 am at [facility]iology. He stated she went to [facility] 2 weeks ago. Also, I advised him with the message about her blood pressure problem and refrain from OT. I gave him the phone number to [facility] audiology to cancel the appointment in March.

## 2017-09-30 DIAGNOSIS — M79601 Pain in right arm: Secondary | ICD-10-CM | POA: Diagnosis not present

## 2017-09-30 DIAGNOSIS — M25511 Pain in right shoulder: Secondary | ICD-10-CM | POA: Diagnosis not present

## 2017-09-30 DIAGNOSIS — M19031 Primary osteoarthritis, right wrist: Secondary | ICD-10-CM | POA: Diagnosis not present

## 2017-09-30 DIAGNOSIS — M6281 Muscle weakness (generalized): Secondary | ICD-10-CM | POA: Diagnosis not present

## 2017-09-30 DIAGNOSIS — M19041 Primary osteoarthritis, right hand: Secondary | ICD-10-CM | POA: Diagnosis not present

## 2017-09-30 DIAGNOSIS — M25531 Pain in right wrist: Secondary | ICD-10-CM | POA: Diagnosis not present

## 2017-10-05 DIAGNOSIS — M79601 Pain in right arm: Secondary | ICD-10-CM | POA: Diagnosis not present

## 2017-10-05 DIAGNOSIS — M6281 Muscle weakness (generalized): Secondary | ICD-10-CM | POA: Diagnosis not present

## 2017-10-05 DIAGNOSIS — M25531 Pain in right wrist: Secondary | ICD-10-CM | POA: Diagnosis not present

## 2017-10-05 DIAGNOSIS — M25511 Pain in right shoulder: Secondary | ICD-10-CM | POA: Diagnosis not present

## 2017-10-06 DIAGNOSIS — M25531 Pain in right wrist: Secondary | ICD-10-CM | POA: Diagnosis not present

## 2017-10-06 DIAGNOSIS — M25511 Pain in right shoulder: Secondary | ICD-10-CM | POA: Diagnosis not present

## 2017-10-06 DIAGNOSIS — M79601 Pain in right arm: Secondary | ICD-10-CM | POA: Diagnosis not present

## 2017-10-06 DIAGNOSIS — M6281 Muscle weakness (generalized): Secondary | ICD-10-CM | POA: Diagnosis not present

## 2017-10-07 ENCOUNTER — Other Ambulatory Visit: Payer: Self-pay

## 2017-10-07 ENCOUNTER — Telehealth: Payer: Self-pay | Admitting: Physician Assistant

## 2017-10-07 DIAGNOSIS — M25511 Pain in right shoulder: Secondary | ICD-10-CM | POA: Diagnosis not present

## 2017-10-07 DIAGNOSIS — R1013 Epigastric pain: Secondary | ICD-10-CM

## 2017-10-07 DIAGNOSIS — M79601 Pain in right arm: Secondary | ICD-10-CM | POA: Diagnosis not present

## 2017-10-07 DIAGNOSIS — M6281 Muscle weakness (generalized): Secondary | ICD-10-CM | POA: Diagnosis not present

## 2017-10-07 DIAGNOSIS — M25531 Pain in right wrist: Secondary | ICD-10-CM | POA: Diagnosis not present

## 2017-10-07 MED ORDER — SUCRALFATE 1 G PO TABS: 1.0000 g | ORAL_TABLET | Freq: Every day | ORAL | 3 refills | Status: DC

## 2017-10-07 NOTE — Telephone Encounter (Signed)
Copied from [place]. Topic: Quick Communication - Rx Refill/Question >> Oct 07, 2017  3:29 PM Cecelia Byars, NT wrote: Has the patient contacted their pharmacy? {yes (Agent: If no, request that the patient contact the pharmacy for the refill.) Preferred Pharmacy (with phone number or street name [facility] on [address]  Agent: Please be advised that RX refills may take up to 3 business days. We ask that you follow-up with your pharmacy. Patient was told by  pharmacy before prescription would be filled she has to  be seen ,she was seen on 09/15/17 at the practice needs refill for sucralfate

## 2017-10-10 DIAGNOSIS — M6281 Muscle weakness (generalized): Secondary | ICD-10-CM | POA: Diagnosis not present

## 2017-10-10 DIAGNOSIS — M25531 Pain in right wrist: Secondary | ICD-10-CM | POA: Diagnosis not present

## 2017-10-10 DIAGNOSIS — M79601 Pain in right arm: Secondary | ICD-10-CM | POA: Diagnosis not present

## 2017-10-10 DIAGNOSIS — M25511 Pain in right shoulder: Secondary | ICD-10-CM | POA: Diagnosis not present

## 2017-10-14 DIAGNOSIS — M25531 Pain in right wrist: Secondary | ICD-10-CM | POA: Diagnosis not present

## 2017-10-14 DIAGNOSIS — M6281 Muscle weakness (generalized): Secondary | ICD-10-CM | POA: Diagnosis not present

## 2017-10-14 DIAGNOSIS — M79601 Pain in right arm: Secondary | ICD-10-CM | POA: Diagnosis not present

## 2017-10-14 DIAGNOSIS — M25511 Pain in right shoulder: Secondary | ICD-10-CM | POA: Diagnosis not present

## 2017-10-18 DIAGNOSIS — M79601 Pain in right arm: Secondary | ICD-10-CM | POA: Diagnosis not present

## 2017-10-18 DIAGNOSIS — M6281 Muscle weakness (generalized): Secondary | ICD-10-CM | POA: Diagnosis not present

## 2017-10-18 DIAGNOSIS — M25531 Pain in right wrist: Secondary | ICD-10-CM | POA: Diagnosis not present

## 2017-10-18 DIAGNOSIS — M25511 Pain in right shoulder: Secondary | ICD-10-CM | POA: Diagnosis not present

## 2017-10-19 DIAGNOSIS — M79601 Pain in right arm: Secondary | ICD-10-CM | POA: Diagnosis not present

## 2017-10-19 DIAGNOSIS — M6281 Muscle weakness (generalized): Secondary | ICD-10-CM | POA: Diagnosis not present

## 2017-10-19 DIAGNOSIS — M25511 Pain in right shoulder: Secondary | ICD-10-CM | POA: Diagnosis not present

## 2017-10-19 DIAGNOSIS — M25531 Pain in right wrist: Secondary | ICD-10-CM | POA: Diagnosis not present

## 2017-10-21 DIAGNOSIS — M6281 Muscle weakness (generalized): Secondary | ICD-10-CM | POA: Diagnosis not present

## 2017-10-21 DIAGNOSIS — M25531 Pain in right wrist: Secondary | ICD-10-CM | POA: Diagnosis not present

## 2017-10-21 DIAGNOSIS — M25511 Pain in right shoulder: Secondary | ICD-10-CM | POA: Diagnosis not present

## 2017-10-21 DIAGNOSIS — M79601 Pain in right arm: Secondary | ICD-10-CM | POA: Diagnosis not present

## 2017-10-24 DIAGNOSIS — M79601 Pain in right arm: Secondary | ICD-10-CM | POA: Diagnosis not present

## 2017-10-24 DIAGNOSIS — M25511 Pain in right shoulder: Secondary | ICD-10-CM | POA: Diagnosis not present

## 2017-10-24 DIAGNOSIS — M25531 Pain in right wrist: Secondary | ICD-10-CM | POA: Diagnosis not present

## 2017-10-24 DIAGNOSIS — M6281 Muscle weakness (generalized): Secondary | ICD-10-CM | POA: Diagnosis not present

## 2017-10-25 DIAGNOSIS — H353213 Exudative age-related macular degeneration, right eye, with inactive scar: Secondary | ICD-10-CM | POA: Diagnosis not present

## 2017-10-25 DIAGNOSIS — H35372 Puckering of macula, left eye: Secondary | ICD-10-CM | POA: Diagnosis not present

## 2017-10-25 DIAGNOSIS — H353221 Exudative age-related macular degeneration, left eye, with active choroidal neovascularization: Secondary | ICD-10-CM | POA: Diagnosis not present

## 2017-10-25 DIAGNOSIS — H43813 Vitreous degeneration, bilateral: Secondary | ICD-10-CM | POA: Diagnosis not present

## 2017-10-28 DIAGNOSIS — M25531 Pain in right wrist: Secondary | ICD-10-CM | POA: Diagnosis not present

## 2017-10-28 DIAGNOSIS — M25511 Pain in right shoulder: Secondary | ICD-10-CM | POA: Diagnosis not present

## 2017-10-28 DIAGNOSIS — M6281 Muscle weakness (generalized): Secondary | ICD-10-CM | POA: Diagnosis not present

## 2017-10-28 DIAGNOSIS — M79601 Pain in right arm: Secondary | ICD-10-CM | POA: Diagnosis not present

## 2017-11-30 DIAGNOSIS — H35421 Microcystoid degeneration of retina, right eye: Secondary | ICD-10-CM | POA: Diagnosis not present

## 2017-11-30 DIAGNOSIS — H35372 Puckering of macula, left eye: Secondary | ICD-10-CM | POA: Diagnosis not present

## 2017-11-30 DIAGNOSIS — H353213 Exudative age-related macular degeneration, right eye, with inactive scar: Secondary | ICD-10-CM | POA: Diagnosis not present

## 2017-11-30 DIAGNOSIS — H353221 Exudative age-related macular degeneration, left eye, with active choroidal neovascularization: Secondary | ICD-10-CM | POA: Diagnosis not present

## 2017-12-22 ENCOUNTER — Telehealth: Payer: Self-pay | Admitting: Physician Assistant

## 2017-12-22 NOTE — Telephone Encounter (Signed)
Copied from [place]. Topic: Quick Communication - See Telephone Encounter >> Dec 22, 2017  8:45 AM Ether Griffins B wrote: CRM for notification. See Telephone encounter for:  [facility] with [facility] is requesting OT verbal orders for 3x a week for 4 weeks. Call back number [phone].  She has tried faxing over the request on 3/7 and 3/12. She's going to try and fax again.  12/22/17.

## 2017-12-23 ENCOUNTER — Telehealth: Payer: Self-pay

## 2017-12-23 NOTE — Telephone Encounter (Signed)
Phone call to [facility]. Verbal orders given for requested OT. She is appreciative, will be sending a form to fax over to [place] to sign as well.

## 2017-12-23 NOTE — Telephone Encounter (Signed)
Phone call to patient. Patient states that is calling for provider's permission to use "wax treatment". She states her son purchased this for her. She states this was used on her arm after it was broken and it was helpful.   Please advise.

## 2017-12-23 NOTE — Telephone Encounter (Signed)
Copied from [place] [id number]. Topic: Inquiry >> Dec 22, 2017  1:31 PM Rebecca Munoz wrote: Reason for CRM: pt called and asked for a call back regarding her health aid(did not disclose company) is needing verbal permission to use what pt states as wax heat for her wrist, contact pt or health aid; health aid's number is attached to the contact info w/ the crm

## 2017-12-24 NOTE — Telephone Encounter (Signed)
Please call pt and let her know I have completed the form and placed it in my CMAs Vaughan Basta) box to be faxed over. She can continue with this treatment as planned. Thanks!

## 2017-12-26 DIAGNOSIS — M6281 Muscle weakness (generalized): Secondary | ICD-10-CM | POA: Diagnosis not present

## 2017-12-26 DIAGNOSIS — M79641 Pain in right hand: Secondary | ICD-10-CM | POA: Diagnosis not present

## 2017-12-26 DIAGNOSIS — R488 Other symbolic dysfunctions: Secondary | ICD-10-CM | POA: Diagnosis not present

## 2017-12-26 NOTE — Telephone Encounter (Signed)
Unable to reach patient no voice mail

## 2017-12-27 ENCOUNTER — Ambulatory Visit: Payer: TRICARE For Life (TFL) | Admitting: Audiology

## 2017-12-27 DIAGNOSIS — M6281 Muscle weakness (generalized): Secondary | ICD-10-CM | POA: Diagnosis not present

## 2017-12-27 DIAGNOSIS — M79641 Pain in right hand: Secondary | ICD-10-CM | POA: Diagnosis not present

## 2017-12-27 DIAGNOSIS — R488 Other symbolic dysfunctions: Secondary | ICD-10-CM | POA: Diagnosis not present

## 2017-12-27 NOTE — Telephone Encounter (Signed)
No voicemail unable to reach

## 2017-12-29 DIAGNOSIS — M6281 Muscle weakness (generalized): Secondary | ICD-10-CM | POA: Diagnosis not present

## 2017-12-29 DIAGNOSIS — R488 Other symbolic dysfunctions: Secondary | ICD-10-CM | POA: Diagnosis not present

## 2017-12-29 DIAGNOSIS — M79641 Pain in right hand: Secondary | ICD-10-CM | POA: Diagnosis not present

## 2018-01-02 DIAGNOSIS — M6281 Muscle weakness (generalized): Secondary | ICD-10-CM | POA: Diagnosis not present

## 2018-01-02 DIAGNOSIS — M79641 Pain in right hand: Secondary | ICD-10-CM | POA: Diagnosis not present

## 2018-01-02 DIAGNOSIS — R488 Other symbolic dysfunctions: Secondary | ICD-10-CM | POA: Diagnosis not present

## 2018-01-04 DIAGNOSIS — H35421 Microcystoid degeneration of retina, right eye: Secondary | ICD-10-CM | POA: Diagnosis not present

## 2018-01-04 DIAGNOSIS — H35372 Puckering of macula, left eye: Secondary | ICD-10-CM | POA: Diagnosis not present

## 2018-01-04 DIAGNOSIS — H353213 Exudative age-related macular degeneration, right eye, with inactive scar: Secondary | ICD-10-CM | POA: Diagnosis not present

## 2018-01-04 DIAGNOSIS — H353221 Exudative age-related macular degeneration, left eye, with active choroidal neovascularization: Secondary | ICD-10-CM | POA: Diagnosis not present

## 2018-01-05 DIAGNOSIS — M6281 Muscle weakness (generalized): Secondary | ICD-10-CM | POA: Diagnosis not present

## 2018-01-05 DIAGNOSIS — R488 Other symbolic dysfunctions: Secondary | ICD-10-CM | POA: Diagnosis not present

## 2018-01-05 DIAGNOSIS — M79641 Pain in right hand: Secondary | ICD-10-CM | POA: Diagnosis not present

## 2018-01-09 DIAGNOSIS — R488 Other symbolic dysfunctions: Secondary | ICD-10-CM | POA: Diagnosis not present

## 2018-01-09 DIAGNOSIS — M79641 Pain in right hand: Secondary | ICD-10-CM | POA: Diagnosis not present

## 2018-01-09 DIAGNOSIS — M6281 Muscle weakness (generalized): Secondary | ICD-10-CM | POA: Diagnosis not present

## 2018-01-12 DIAGNOSIS — R488 Other symbolic dysfunctions: Secondary | ICD-10-CM | POA: Diagnosis not present

## 2018-01-12 DIAGNOSIS — M79641 Pain in right hand: Secondary | ICD-10-CM | POA: Diagnosis not present

## 2018-01-12 DIAGNOSIS — M6281 Muscle weakness (generalized): Secondary | ICD-10-CM | POA: Diagnosis not present

## 2018-01-17 DIAGNOSIS — M79641 Pain in right hand: Secondary | ICD-10-CM | POA: Diagnosis not present

## 2018-01-17 DIAGNOSIS — M6281 Muscle weakness (generalized): Secondary | ICD-10-CM | POA: Diagnosis not present

## 2018-01-17 DIAGNOSIS — R488 Other symbolic dysfunctions: Secondary | ICD-10-CM | POA: Diagnosis not present

## 2018-01-18 ENCOUNTER — Other Ambulatory Visit: Payer: Self-pay | Admitting: Physician Assistant

## 2018-01-18 DIAGNOSIS — J309 Allergic rhinitis, unspecified: Secondary | ICD-10-CM

## 2018-01-19 DIAGNOSIS — M79641 Pain in right hand: Secondary | ICD-10-CM | POA: Diagnosis not present

## 2018-01-19 DIAGNOSIS — R488 Other symbolic dysfunctions: Secondary | ICD-10-CM | POA: Diagnosis not present

## 2018-01-19 DIAGNOSIS — M6281 Muscle weakness (generalized): Secondary | ICD-10-CM | POA: Diagnosis not present

## 2018-01-24 DIAGNOSIS — M6281 Muscle weakness (generalized): Secondary | ICD-10-CM | POA: Diagnosis not present

## 2018-01-24 DIAGNOSIS — M79641 Pain in right hand: Secondary | ICD-10-CM | POA: Diagnosis not present

## 2018-01-24 DIAGNOSIS — R488 Other symbolic dysfunctions: Secondary | ICD-10-CM | POA: Diagnosis not present

## 2018-01-26 DIAGNOSIS — M6281 Muscle weakness (generalized): Secondary | ICD-10-CM | POA: Diagnosis not present

## 2018-01-26 DIAGNOSIS — M79641 Pain in right hand: Secondary | ICD-10-CM | POA: Diagnosis not present

## 2018-01-26 DIAGNOSIS — R488 Other symbolic dysfunctions: Secondary | ICD-10-CM | POA: Diagnosis not present

## 2018-01-30 DIAGNOSIS — R488 Other symbolic dysfunctions: Secondary | ICD-10-CM | POA: Diagnosis not present

## 2018-01-30 DIAGNOSIS — M79641 Pain in right hand: Secondary | ICD-10-CM | POA: Diagnosis not present

## 2018-01-30 DIAGNOSIS — M6281 Muscle weakness (generalized): Secondary | ICD-10-CM | POA: Diagnosis not present

## 2018-02-02 DIAGNOSIS — R488 Other symbolic dysfunctions: Secondary | ICD-10-CM | POA: Diagnosis not present

## 2018-02-02 DIAGNOSIS — M79641 Pain in right hand: Secondary | ICD-10-CM | POA: Diagnosis not present

## 2018-02-02 DIAGNOSIS — M6281 Muscle weakness (generalized): Secondary | ICD-10-CM | POA: Diagnosis not present

## 2018-02-07 DIAGNOSIS — M6281 Muscle weakness (generalized): Secondary | ICD-10-CM | POA: Diagnosis not present

## 2018-02-07 DIAGNOSIS — M79641 Pain in right hand: Secondary | ICD-10-CM | POA: Diagnosis not present

## 2018-02-07 DIAGNOSIS — R488 Other symbolic dysfunctions: Secondary | ICD-10-CM | POA: Diagnosis not present

## 2018-02-22 DIAGNOSIS — H43813 Vitreous degeneration, bilateral: Secondary | ICD-10-CM | POA: Diagnosis not present

## 2018-02-22 DIAGNOSIS — H35372 Puckering of macula, left eye: Secondary | ICD-10-CM | POA: Diagnosis not present

## 2018-02-22 DIAGNOSIS — H353221 Exudative age-related macular degeneration, left eye, with active choroidal neovascularization: Secondary | ICD-10-CM | POA: Diagnosis not present

## 2018-02-22 DIAGNOSIS — H353213 Exudative age-related macular degeneration, right eye, with inactive scar: Secondary | ICD-10-CM | POA: Diagnosis not present

## 2018-03-08 ENCOUNTER — Encounter: Payer: Self-pay | Admitting: Family Medicine

## 2018-03-24 ENCOUNTER — Ambulatory Visit (INDEPENDENT_AMBULATORY_CARE_PROVIDER_SITE_OTHER): Payer: Medicare Other | Admitting: Physician Assistant

## 2018-03-24 ENCOUNTER — Encounter: Payer: Self-pay | Admitting: Physician Assistant

## 2018-03-24 VITALS — BP 113/57 | HR 67 | Temp 97.9°F | Resp 18 | Ht 65.0 in | Wt 130.4 lb

## 2018-03-24 DIAGNOSIS — L989 Disorder of the skin and subcutaneous tissue, unspecified: Secondary | ICD-10-CM | POA: Diagnosis not present

## 2018-03-24 DIAGNOSIS — R5383 Other fatigue: Secondary | ICD-10-CM | POA: Diagnosis not present

## 2018-03-24 MED ORDER — MUPIROCIN 2 % EX OINT: 1.0000 "application " | TOPICAL_OINTMENT | Freq: Three times a day (TID) | CUTANEOUS | 0 refills | Status: DC

## 2018-03-24 NOTE — Progress Notes (Signed)
Rebecca Munoz  MRN: [id number] DOB: [birth year]  Subjective:   Rebecca Munoz is a 84 y.o. female who presents for evaluation of fatigue and "pimples on face." Fatigue symptoms began 9 months ago after she had right humerus fracture. Since her injury, she has graduated to a rolling walker and absolutely hates the idea of having to use it all the time. Prior to the fx, she ambulated without any assistance and this really bothers her to rely on something. She has completed PT and has gotten some ROM back in the right arm but nothing like she had before the injury. Symptoms of her fatigue have been general malaise. Patient describes the following psychological symptoms: stress at her facility. Lots of her friends at her living facility are having issues and she worries about them a lot. . Patient denies change in hair texture, cold intolerance, constipation, exercise intolerance, fever, GI blood loss, significant change in weight, unusual rashes and witnessed or suspected sleep apnea. Symptoms have stabilized. Symptom severity: symptoms bothersome, but easily able to carry out all usual work/school/family activities. Previous visits for this problem: none. She continues to do her daily exercises. Eats 3 light meals throughout the day. Drinks plenty of water.Takes multivitamin. Gets about 6-8 hours of sleep at night but does lie in bed worrying about her friends prior to going to sleep.  Pimple on forehead x months. No change in size. Uses daily facial cleanser and cream with no relief. No overlying redness, warmth, or pustule. Has some on back of neck as well that will come and go. Has noticed white heads on the ones on back of neck, she tries to scrape it off. Has hx of MRSA.   Review of Systems  Constitutional: Negative for diaphoresis and fever.  Respiratory: Negative for cough and shortness of breath.   Cardiovascular: Negative for chest pain, palpitations and leg swelling.  Gastrointestinal:  Negative for abdominal pain, nausea and vomiting.  Skin: Negative for pallor.  Neurological: Negative for dizziness, light-headedness, numbness and headaches.  Psychiatric/Behavioral: Negative for confusion.    There are no active problems to display for this patient.   Current Outpatient Medications on File Prior to Visit  Medication Sig Dispense Refill  . acetaminophen (TYLENOL) 500 MG tablet Take 1 tablet (500 mg total) by mouth every 6 (six) hours as needed. 30 tablet 0  . aspirin 81 MG tablet Take 81 mg by mouth daily.    Marland Kitchen CALCIUM PO Take 1 tablet by mouth daily.    Marland Kitchen loratadine (CLARITIN) 10 MG tablet TAKE 1 TABLET BY MOUTH EVERY DAY 90 tablet 0  . Melatonin-Pyridoxine (MELATIN PO) Take by mouth at bedtime as needed.    . Multiple Vitamins-Minerals (MULTIVITAMIN WITH MINERALS) tablet Take 1 tablet by mouth daily.    Marland Kitchen omeprazole (PRILOSEC) 20 MG capsule Take 1 capsule (20 mg total) by mouth daily as needed. (Patient taking differently: Take 20 mg by mouth daily as needed (reflux). ) 30 capsule 0  . sucralfate (CARAFATE) 1 g tablet Take 1 tablet (1 g total) by mouth daily with breakfast. 60 tablet 3  . TURMERIC PO Take by mouth daily.     No current facility-administered medications on file prior to visit.     No Known Allergies    Social History   Socioeconomic History  . Marital status: Widowed    Spouse name: Not on file  . Number of children: Not on file  . Years of education: Not on file  .  Highest education level: Not on file  Occupational History  . Not on file  Social Needs  . Financial resource strain: Not on file  . Food insecurity:    Worry: Not on file    Inability: Not on file  . Transportation needs:    Medical: Not on file    Non-medical: Not on file  Tobacco Use  . Smoking status: Never Smoker  . Smokeless tobacco: Never Used  Substance and Sexual Activity  . Alcohol use: No    Alcohol/week: 0.0 oz  . Drug use: No  . Sexual activity: Not on  file  Lifestyle  . Physical activity:    Days per week: Not on file    Minutes per session: Not on file  . Stress: Not on file  Relationships  . Social connections:    Talks on phone: Not on file    Gets together: Not on file    Attends religious service: Not on file    Active member of club or organization: Not on file    Attends meetings of clubs or organizations: Not on file    Relationship status: Not on file  . Intimate partner violence:    Fear of current or ex partner: Not on file    Emotionally abused: Not on file    Physically abused: Not on file    Forced sexual activity: Not on file  Other Topics Concern  . Not on file  Social History Narrative  . Not on file    Objective:  BP (!) 113/57   Pulse 67   Temp 97.9 F (36.6 C) (Oral)   Resp 18   Ht _0  (1.651 m)   Wt 130 lb 6.4 oz (59.1 kg)   SpO2 94%   BMI 21.70 kg/m   Physical Exam  Constitutional: She is oriented to person, place, and time. She appears well-developed and well-nourished. No distress.  Well groomed.   HENT:  Head: Normocephalic and atraumatic.  Mouth/Throat: Uvula is midline, oropharynx is clear and moist and mucous membranes are normal.  Eyes: Pupils are equal, round, and reactive to light. Conjunctivae and EOM are normal.  Neck: Normal range of motion.  Cardiovascular: Normal rate, regular rhythm, normal heart sounds and intact distal pulses.  Pulmonary/Chest: Effort normal and breath sounds normal.  Musculoskeletal:       Right shoulder: She exhibits decreased range of motion.       Left shoulder: Normal.  Neurological: She is alert and oriented to person, place, and time.  Uses rolling walker for assistance.   Skin: Skin is warm and dry.     Psychiatric: She has a normal mood and affect.  Vitals reviewed.   Assessment and Plan :  1. Fatigue, unspecified type Likely due to stress and new addition of walking assistive device. Pt experienced a humeral head fx in 06/2017 and this  has been very hard for her as she was completely healthy and did not rely on anything/anyone prior to this incident. No other concerning sx noted. She is overall well appearing, NAD. Vitals stable. No acute findings noted on PE.  Will check CBC and CMP. Recommended continuing light frequent meals, water, exercise, activities, multivitamin. Start meditation at night time. May use melatonin for sleep. Advised to Return to clinic if symptoms worsen, do not improve, or as needed.  - CMP14+EGFR - CBC with Differential/Platelet  2. Skin lesion Will refer to derm for lesion on forehead. Lesion on posterior neck looks like  folliculitis. Will tx with mupirocin ointment. -Return to clinic if symptoms worsen, do not improve, or as needed.  - Ambulatory referral to Dermatology - mupirocin ointment (BACTROBAN) 2 %; Apply 1 application topically 3 (three) times daily.  Dispense: 22 g; Refill: 0   Tenna Delaine PA-C  [facility] at [place] 03/24/2018 1:22 PM

## 2018-03-24 NOTE — Patient Instructions (Addendum)
For bumps on back of neck, use cream prescribed three times a day for up to 10 days.  For face lesion, I have placed a referral for dermatology and they should contact you within 2 weeks.  For overall fatigue, we have checked basic labs today and should have those results back within a few days. I think this is all stemming from stress and constant use of the rolling walker. I recommend spending time at night meditating before lying in your bed to sleep. Continue exercising and doing activities you enjoy. Eat frequent light meals throughout the day. Continue drinking water daily. Hopefully, if you can get better sleep throughout the night, this will help with overall fatigue. Return to clinic if symptoms worsen, do not improve, or as needed.    Chronic Fatigue Syndrome Chronic fatigue syndrome (CFS) is a condition that causes extreme tiredness (fatigue). This fatigue does not improve with rest, and it gets worse with physical or mental activity. You may have several other symptoms along with fatigue. Symptoms may come and go, but they generally last for months. Sometimes, CFS gets better over time, but it can be a lifelong condition. There is no cure, but there are many possible treatments. You will need to work with your health care providers to find a treatment plan that works best for you. What are the causes? The cause of CFS is not known. There may be more than one cause. Possible causes include:  An infection.  An abnormal body defense system (immune system).  Low blood pressure.  Poor diet.  Physical or emotional stress.  What increases the risk? You are more likely to develop this condition if:  You are female.  You are 64?82 years old.  You have a family history of CFS.  You live with a lot of emotional stress.  What are the signs or symptoms? The main symptom of CFS is fatigue that is severe enough to interfere with day-to-day activities. This fatigue does not get better  with rest, and it gets worse with physical or mental activity. There are eight other major symptoms of CFS:  Lack of energy (malaise) that lasts more than 24 hours after physical exertion.  Sleep that does not relieve fatigue (unrefreshing sleep).  Short-term memory loss or confusion.  Joint pain without redness or swelling.  Muscle aches.  Headaches.  Painful and swollen glands (lymph nodes) in the neck or under the arms.  Sore throat.  You may also have:  Abdominal cramps, constipation, or diarrhea (irritable bowel).  Chills.  Night sweats.  Vision changes.  Dizziness.  Mental confusion (brain fog).  Clumsiness.  Sensitivity to food, noise, or odors.  Mood swings, depression, or anxiety attacks.  How is this diagnosed? There are no tests that can diagnose this condition. Your health care provider will make the diagnosis based on your medical history, a physical exam, and a mental health exam. However, it is important to make sure that your symptoms are not caused by another medical condition. You may have lab tests or X-rays to rule out other conditions. For your health care provider to diagnose CFS:  You must have had fatigue for at least 6 straight months.  Fatigue must be your first symptom, and it must be severe enough to interfere with day-to-day activities.  There must be no other cause found for the fatigue.  You must also have at least four of the eight other major symptoms of CFS.  How is this treated? There  is no cure for CFS. The condition affects everyone differently. You will need to work with your team of health care providers to find the best treatments for your symptoms. Your team may include your primary care provider, physical and exercise therapists, and mental health therapists. Treatment may include:  Improving sleep with a regular bedtime routine.  Avoiding caffeine, alcohol, and tobacco.  Doing light exercise and stretching during the  day.  Taking medicines to help you sleep or to relieve joint or muscle pain.  Learning and practicing relaxation techniques.  Using memory aids or doing brainteasers to improve memory and concentration.  Seeing a mental health therapist to evaluate and treat depression, if necessary.  Trying massage therapy, acupuncture, and movement exercises, such as yoga or tai chi.  Follow these instructions at home:  Activity  Exercise regularly, as told by your health care provider.  Avoid fatigue by pacing yourself during the day and getting enough sleep at night.  Go to bed and get up at the same time every day. Eating and drinking  Avoid caffeine and alcohol.  Avoid heavy meals in the evening.  Eat a well-balanced diet. General instructions  Take over-the-counter and prescription medicines only as told by your health care provider.  Do not use herbal or dietary supplements unless they are approved by your health care provider.  Maintain a healthy weight.  Avoid stress and use stress-reducing techniques that you learn in therapy.  Do not use any products that contain nicotine or tobacco, such as cigarettes and e-cigarettes. If you need help quitting, ask your health care provider.  Consider joining a CFS support group.  Keep all follow-up visits as told by your health care provider. This is important. Contact a health care provider if:  Your symptoms do not get better or they get worse.  You feel angry, guilty, anxious, or depressed. This information is not intended to replace advice given to you by your health care provider. Make sure you discuss any questions you have with your health care provider. Document Released: 11/04/2004 Document Revised: 06/03/2016 Document Reviewed: 01/05/2016 Elsevier Interactive Patient Education  2018 [employer].   IF you received an x-ray today, you will receive an invoice from [facility]. Please contact [facility] at  [phone] with questions or concerns regarding your invoice.   IF you received labwork today, you will receive an invoice from [facility]. Please contact [facility] at [phone] with questions or concerns regarding your invoice.   Our billing staff will not be able to assist you with questions regarding bills from these companies.  You will be contacted with the lab results as soon as they are available. The fastest way to get your results is to activate your My Chart account. Instructions are located on the last page of this paperwork. If you have not heard from [place] regarding the results in 2 weeks, please contact this office.

## 2018-03-25 LAB — CBC WITH DIFFERENTIAL/PLATELET
Basophils Absolute: 0 10*3/uL (ref 0.0–0.2)
Basos: 1 %
EOS (ABSOLUTE): 0.3 10*3/uL (ref 0.0–0.4)
Eos: 6 %
Hematocrit: 38.7 % (ref 34.0–46.6)
Hemoglobin: 13.3 g/dL (ref 11.1–15.9)
Immature Grans (Abs): 0 10*3/uL (ref 0.0–0.1)
Immature Granulocytes: 0 %
LYMPHS ABS: 1.5 10*3/uL (ref 0.7–3.1)
LYMPHS: 27 %
MCH: 30.9 pg (ref 26.6–33.0)
MCHC: 34.4 g/dL (ref 31.5–35.7)
MCV: 90 fL (ref 79–97)
MONOS ABS: 0.5 10*3/uL (ref 0.1–0.9)
Monocytes: 10 %
NEUTROS ABS: 3.1 10*3/uL (ref 1.4–7.0)
Neutrophils: 56 %
PLATELETS: 204 10*3/uL (ref 150–450)
RBC: 4.3 x10E6/uL (ref 3.77–5.28)
RDW: 14.2 % (ref 12.3–15.4)
WBC: 5.5 10*3/uL (ref 3.4–10.8)

## 2018-03-25 LAB — CMP14+EGFR
A/G RATIO: 2 (ref 1.2–2.2)
ALT: 19 IU/L (ref 0–32)
AST: 17 IU/L (ref 0–40)
Albumin: 4.2 g/dL (ref 3.2–4.6)
Alkaline Phosphatase: 117 IU/L (ref 39–117)
BILIRUBIN TOTAL: 0.4 mg/dL (ref 0.0–1.2)
BUN / CREAT RATIO: 29 — AB (ref 12–28)
BUN: 20 mg/dL (ref 10–36)
CHLORIDE: 106 mmol/L (ref 96–106)
CO2: 25 mmol/L (ref 20–29)
Calcium: 9.8 mg/dL (ref 8.7–10.3)
Creatinine, Ser: 0.7 mg/dL (ref 0.57–1.00)
GFR calc non Af Amer: 74 mL/min/{1.73_m2} (ref >59)
GFR, EST AFRICAN AMERICAN: 85 mL/min/{1.73_m2} (ref >59)
Globulin, Total: 2.1 g/dL (ref 1.5–4.5)
Glucose: 134 mg/dL — ABNORMAL HIGH (ref 65–99)
POTASSIUM: 4.5 mmol/L (ref 3.5–5.2)
Sodium: 142 mmol/L (ref 134–144)
TOTAL PROTEIN: 6.3 g/dL (ref 6.0–8.5)

## 2018-04-19 DIAGNOSIS — H35421 Microcystoid degeneration of retina, right eye: Secondary | ICD-10-CM | POA: Diagnosis not present

## 2018-04-19 DIAGNOSIS — H35372 Puckering of macula, left eye: Secondary | ICD-10-CM | POA: Diagnosis not present

## 2018-04-19 DIAGNOSIS — H353221 Exudative age-related macular degeneration, left eye, with active choroidal neovascularization: Secondary | ICD-10-CM | POA: Diagnosis not present

## 2018-04-19 DIAGNOSIS — H353213 Exudative age-related macular degeneration, right eye, with inactive scar: Secondary | ICD-10-CM | POA: Diagnosis not present

## 2018-04-21 DIAGNOSIS — H353213 Exudative age-related macular degeneration, right eye, with inactive scar: Secondary | ICD-10-CM | POA: Diagnosis not present

## 2018-04-21 DIAGNOSIS — H1132 Conjunctival hemorrhage, left eye: Secondary | ICD-10-CM | POA: Diagnosis not present

## 2018-04-21 DIAGNOSIS — H353221 Exudative age-related macular degeneration, left eye, with active choroidal neovascularization: Secondary | ICD-10-CM | POA: Diagnosis not present

## 2018-04-21 DIAGNOSIS — H35372 Puckering of macula, left eye: Secondary | ICD-10-CM | POA: Diagnosis not present

## 2018-05-14 ENCOUNTER — Encounter (HOSPITAL_BASED_OUTPATIENT_CLINIC_OR_DEPARTMENT_OTHER): Payer: Self-pay | Admitting: Emergency Medicine

## 2018-05-14 ENCOUNTER — Emergency Department (HOSPITAL_BASED_OUTPATIENT_CLINIC_OR_DEPARTMENT_OTHER): Payer: Medicare Other

## 2018-05-14 ENCOUNTER — Other Ambulatory Visit: Payer: Self-pay

## 2018-05-14 ENCOUNTER — Observation Stay (HOSPITAL_BASED_OUTPATIENT_CLINIC_OR_DEPARTMENT_OTHER)
Admission: EM | Admit: 2018-05-14 | Discharge: 2018-05-15 | Disposition: A | Discharge status: Home / Self Care | Payer: Medicare Other | Attending: Internal Medicine | Admitting: Internal Medicine

## 2018-05-14 DIAGNOSIS — R008 Other abnormalities of heart beat: Secondary | ICD-10-CM | POA: Insufficient documentation

## 2018-05-14 DIAGNOSIS — R55 Syncope and collapse: Secondary | ICD-10-CM | POA: Diagnosis present

## 2018-05-14 DIAGNOSIS — Z66 Do not resuscitate: Secondary | ICD-10-CM | POA: Insufficient documentation

## 2018-05-14 DIAGNOSIS — I499 Cardiac arrhythmia, unspecified: Secondary | ICD-10-CM

## 2018-05-14 DIAGNOSIS — R001 Bradycardia, unspecified: Secondary | ICD-10-CM | POA: Diagnosis not present

## 2018-05-14 DIAGNOSIS — Z9889 Other specified postprocedural states: Secondary | ICD-10-CM | POA: Insufficient documentation

## 2018-05-14 DIAGNOSIS — M810 Age-related osteoporosis without current pathological fracture: Secondary | ICD-10-CM | POA: Insufficient documentation

## 2018-05-14 DIAGNOSIS — Z853 Personal history of malignant neoplasm of breast: Secondary | ICD-10-CM | POA: Insufficient documentation

## 2018-05-14 DIAGNOSIS — Z9660 Presence of unspecified orthopedic joint implant: Secondary | ICD-10-CM | POA: Diagnosis not present

## 2018-05-14 DIAGNOSIS — R42 Dizziness and giddiness: Principal | ICD-10-CM | POA: Insufficient documentation

## 2018-05-14 DIAGNOSIS — I498 Other specified cardiac arrhythmias: Secondary | ICD-10-CM | POA: Diagnosis present

## 2018-05-14 DIAGNOSIS — Z7982 Long term (current) use of aspirin: Secondary | ICD-10-CM | POA: Diagnosis not present

## 2018-05-14 DIAGNOSIS — Z79899 Other long term (current) drug therapy: Secondary | ICD-10-CM | POA: Insufficient documentation

## 2018-05-14 DIAGNOSIS — R1013 Epigastric pain: Secondary | ICD-10-CM

## 2018-05-14 LAB — COMPREHENSIVE METABOLIC PANEL
ALBUMIN: 3.9 g/dL (ref 3.5–5.0)
ALT: 23 U/L (ref 0–44)
ANION GAP: 7 (ref 5–15)
AST: 23 U/L (ref 15–41)
Alkaline Phosphatase: 114 U/L (ref 38–126)
BILIRUBIN TOTAL: 0.5 mg/dL (ref 0.3–1.2)
BUN: 19 mg/dL (ref 8–23)
CALCIUM: 9.8 mg/dL (ref 8.9–10.3)
CO2: 26 mmol/L (ref 22–32)
Chloride: 108 mmol/L (ref 98–111)
Creatinine, Ser: 0.73 mg/dL (ref 0.44–1.00)
GFR calc non Af Amer: >60 mL/min (ref >60)
GLUCOSE: 87 mg/dL (ref 70–99)
POTASSIUM: 4 mmol/L (ref 3.5–5.1)
Sodium: 141 mmol/L (ref 135–145)
TOTAL PROTEIN: 6.8 g/dL (ref 6.5–8.1)

## 2018-05-14 LAB — MAGNESIUM: MAGNESIUM: 2.4 mg/dL (ref 1.7–2.4)

## 2018-05-14 LAB — URINALYSIS, ROUTINE W REFLEX MICROSCOPIC
BILIRUBIN URINE: NEGATIVE
GLUCOSE, UA: NEGATIVE mg/dL
Hgb urine dipstick: NEGATIVE
KETONES UR: NEGATIVE mg/dL
Leukocytes, UA: NEGATIVE
Nitrite: NEGATIVE
PH: 6 (ref 5.0–8.0)
Protein, ur: NEGATIVE mg/dL
Specific Gravity, Urine: <1.005 — ABNORMAL LOW (ref 1.005–1.030)

## 2018-05-14 LAB — CBC WITH DIFFERENTIAL/PLATELET
BASOS PCT: 0 %
Basophils Absolute: 0 10*3/uL (ref 0.0–0.1)
EOS ABS: 0.4 10*3/uL (ref 0.0–0.7)
Eosinophils Relative: 6 %
HCT: 39.4 % (ref 36.0–46.0)
Hemoglobin: 13.2 g/dL (ref 12.0–15.0)
LYMPHS ABS: 2.2 10*3/uL (ref 0.7–4.0)
Lymphocytes Relative: 38 %
MCH: 30.8 pg (ref 26.0–34.0)
MCHC: 33.5 g/dL (ref 30.0–36.0)
MCV: 91.8 fL (ref 78.0–100.0)
MONO ABS: 0.7 10*3/uL (ref 0.1–1.0)
MONOS PCT: 12 %
Neutro Abs: 2.6 10*3/uL (ref 1.7–7.7)
Neutrophils Relative %: 44 %
Platelets: 187 10*3/uL (ref 150–400)
RBC: 4.29 MIL/uL (ref 3.87–5.11)
RDW: 14.6 % (ref 11.5–15.5)
WBC: 5.9 10*3/uL (ref 4.0–10.5)

## 2018-05-14 LAB — PHOSPHORUS: PHOSPHORUS: 3.9 mg/dL (ref 2.5–4.6)

## 2018-05-14 LAB — TROPONIN I

## 2018-05-14 MED ORDER — ADULT MULTIVITAMIN W/MINERALS CH
1.0000 | ORAL_TABLET | Freq: Every day | ORAL | Status: DC
  Administered 2018-05-15: 1 via ORAL
  Filled 2018-05-14: qty 1

## 2018-05-14 MED ORDER — SENNOSIDES-DOCUSATE SODIUM 8.6-50 MG PO TABS: 1.0000 | ORAL_TABLET | Freq: Every evening | ORAL | Status: DC | PRN

## 2018-05-14 MED ORDER — HYDROCODONE-ACETAMINOPHEN 5-325 MG PO TABS: 1.0000 | ORAL_TABLET | ORAL | Status: DC | PRN

## 2018-05-14 MED ORDER — ENOXAPARIN SODIUM 40 MG/0.4ML ~~LOC~~ SOLN
40.0000 mg | Freq: Every day | SUBCUTANEOUS | Status: DC
  Administered 2018-05-15: 40 mg via SUBCUTANEOUS
  Filled 2018-05-14: qty 0.4

## 2018-05-14 MED ORDER — ONDANSETRON HCL 4 MG/2ML IJ SOLN: 4.0000 mg | Freq: Four times a day (QID) | INTRAMUSCULAR | Status: DC | PRN

## 2018-05-14 MED ORDER — SUCRALFATE 1 G PO TABS
1.0000 g | ORAL_TABLET | Freq: Every day | ORAL | Status: DC
  Administered 2018-05-15: 1 g via ORAL
  Filled 2018-05-14: qty 1

## 2018-05-14 MED ORDER — ACETAMINOPHEN 650 MG RE SUPP: 650.0000 mg | Freq: Four times a day (QID) | RECTAL | Status: DC | PRN

## 2018-05-14 MED ORDER — SODIUM CHLORIDE 0.9% FLUSH
3.0000 mL | Freq: Two times a day (BID) | INTRAVENOUS | Status: DC
  Administered 2018-05-14 – 2018-05-15 (×2): 3 mL via INTRAVENOUS

## 2018-05-14 MED ORDER — SODIUM CHLORIDE 0.9 % IV BOLUS
500.0000 mL | Freq: Once | INTRAVENOUS | Status: AC
  Administered 2018-05-14: 500 mL via INTRAVENOUS

## 2018-05-14 MED ORDER — MELATONIN 3 MG PO TABS: 3.0000 mg | ORAL_TABLET | Freq: Every evening | ORAL | Status: DC | PRN

## 2018-05-14 MED ORDER — ACETAMINOPHEN 325 MG PO TABS: 650.0000 mg | ORAL_TABLET | Freq: Four times a day (QID) | ORAL | Status: DC | PRN

## 2018-05-14 MED ORDER — ASPIRIN EC 81 MG PO TBEC
81.0000 mg | DELAYED_RELEASE_TABLET | Freq: Every day | ORAL | Status: DC
  Administered 2018-05-15: 81 mg via ORAL
  Filled 2018-05-14: qty 1

## 2018-05-14 MED ORDER — ONDANSETRON HCL 4 MG PO TABS: 4.0000 mg | ORAL_TABLET | Freq: Four times a day (QID) | ORAL | Status: DC | PRN

## 2018-05-14 MED ORDER — SODIUM CHLORIDE 0.9 % IV SOLN
INTRAVENOUS | Status: AC
  Administered 2018-05-14: via INTRAVENOUS

## 2018-05-14 NOTE — ED Notes (Signed)
Pt assisted to bathroom with walker.

## 2018-05-14 NOTE — Progress Notes (Signed)
Patient was transferred from [facility] med center and was admitted and oriented to the unit. Admitting was paged.  Patient is stable. Will continue to monitor.     Drue Flirt, RN

## 2018-05-14 NOTE — H&P (Signed)
History and Physical    Rhiannan Kievit FAO:[id number] DOB: 1921/03/01 DOA: 05/14/2018  PCP: Leonie Douglas, PA-C   Patient coming from: Home  Chief Complaint: lightheaded, near syncope   HPI: Rebecca Munoz is a 84 y.o. female with medical history significant for history of breast cancer status post lumpectomy, now presenting to the emergency department for evaluation of lightheadedness and near syncope.  Patient reports that she had a brief episode of chest pain 1 to 2 weeks ago that resolved spontaneously.  She went on to be in her usual state of health until the morning of 05/13/2018 when she developed lightheadedness, worse when she was standing and active.  There was no associated chest pain or palpitations with this and she denies any recent fevers, chills, or shortness of breath.  She has felt as though she would lose consciousness with some of these episodes, but denies any actual loss of consciousness, fall, or injury.  ED Course: Upon arrival to the ED, patient is found to be afebrile, saturating well on room air, heart rate ranging from 38-85, and mildly hypertensive.  EKG features a sinus rhythm with ventricular bigeminy.  Chest x-ray is suspicious for possible left lung nodules, but no edema or consolidation.  Chemistry panel and CBC are unremarkable, troponin is undetectable, and urinalysis is notable for a low specific gravity only.  Cardiology was contacted by the ED physician and recommended medical admission and IV fluid hydration.  Patient was given 500 cc normal saline in the ED.  Blood pressure remained stable.  She will be observed on telemetry for ongoing evaluation and management.  Review of Systems:  All other systems reviewed and apart from HPI, are negative.  Past Medical History:  Diagnosis Date  . Cancer [facility])    breast  . Osteoporosis     Past Surgical History:  Procedure Laterality Date  . APPENDECTOMY    . BACK SURGERY    . BREAST SURGERY Right    lumpectomy   . EYE SURGERY    . JOINT REPLACEMENT       reports that she has never smoked. She has never used smokeless tobacco. She reports that she does not drink alcohol or use drugs.  No Known Allergies  History reviewed. No pertinent family history.   Prior to Admission medications   Medication Sig Start Date End Date Taking? Authorizing Provider  acetaminophen (TYLENOL) 500 MG tablet Take 1 tablet (500 mg total) by mouth every 6 (six) hours as needed. 06/18/17   Kinnie Feil, PA-C  aspirin 81 MG tablet Take 81 mg by mouth daily.    [provider]  CALCIUM PO Take 1 tablet by mouth daily.    [provider]  loratadine (CLARITIN) 10 MG tablet TAKE 1 TABLET BY MOUTH EVERY DAY 01/18/18   Tenna Delaine D, PA-C  Melatonin-Pyridoxine (MELATIN PO) Take by mouth at bedtime as needed.    [provider]  Multiple Vitamins-Minerals (MULTIVITAMIN WITH MINERALS) tablet Take 1 tablet by mouth daily.    [provider]  mupirocin ointment (BACTROBAN) 2 % Apply 1 application topically 3 (three) times daily. 03/24/18   Tenna Delaine D, PA-C  omeprazole (PRILOSEC) 20 MG capsule Take 1 capsule (20 mg total) by mouth daily as needed. Patient taking differently: Take 20 mg by mouth daily as needed (reflux).  04/27/17   Tenna Delaine D, PA-C  sucralfate (CARAFATE) 1 g tablet Take 1 tablet (1 g total) by mouth daily with breakfast. 10/07/17  Tenna Delaine D, PA-C  TURMERIC PO Take by mouth daily.    [provider]    Physical Exam: Vitals:   05/14/18 1830 05/14/18 1856 05/14/18 2026 05/14/18 2140  BP: 97/70 (!) 186/64 (!) 192/90   Pulse: (!) 43 (!) 41 85   Resp: 19 17 16    Temp:      TempSrc:      SpO2: 97% 94% 97%   Weight:    59.4 kg (131 lb)  Height:    5\' 6"  (1.676 m)      Constitutional: NAD, calm  Eyes: PERTLA, lids and conjunctivae normal ENMT: Mucous membranes are moist. Posterior pharynx clear of any exudate or  lesions.   Neck: normal, supple, no masses, no thyromegaly Respiratory: clear to auscultation bilaterally, no wheezing, no crackles. Normal respiratory effort.   Cardiovascular: Rate ~80 and irregular. No extremity edema.   Abdomen: No distension, no tenderness, soft. Bowel sounds normal.  Musculoskeletal: no clubbing / cyanosis. No joint deformity upper and lower extremities.   Skin: no significant rashes, lesions, ulcers. Warm, dry, well-perfused. Neurologic: CN 2-12 grossly intact. Sensation intact. Strength 5/5 in all 4 limbs.  Psychiatric: Alert and oriented to person, place, and situation, but not oriented to month or year. Pleasant and cooperative.     Labs on Admission: I have personally reviewed following labs and imaging studies  CBC: Recent Labs  Lab 05/14/18 1608  WBC 5.9  NEUTROABS 2.6  HGB 13.2  HCT 39.4  MCV 91.8  PLT 149   Basic Metabolic Panel: Recent Labs  Lab 05/14/18 1608  NA 141  K 4.0  CL 108  CO2 26  GLUCOSE 87  BUN 19  CREATININE 0.73  CALCIUM 9.8  MG 2.4  PHOS 3.9   GFR: Estimated Creatinine Clearance: 39.4 mL/min (by C-G formula based on SCr of 0.73 mg/dL). Liver Function Tests: Recent Labs  Lab 05/14/18 1608  AST 23  ALT 23  ALKPHOS 114  BILITOT 0.5  PROT 6.8  ALBUMIN 3.9   No results for input(s): LIPASE, AMYLASE in the last 168 hours. No results for input(s): AMMONIA in the last 168 hours. Coagulation Profile: No results for input(s): INR, PROTIME in the last 168 hours. Cardiac Enzymes: Recent Labs  Lab 05/14/18 1608  TROPONINI <0.03   BNP (last 3 results) No results for input(s): PROBNP in the last 8760 hours. HbA1C: No results for input(s): HGBA1C in the last 72 hours. CBG: No results for input(s): GLUCAP in the last 168 hours. Lipid Profile: No results for input(s): CHOL, HDL, LDLCALC, TRIG, CHOLHDL, LDLDIRECT in the last 72 hours. Thyroid Function Tests: No results for input(s): TSH, T4TOTAL, FREET4, T3FREE,  THYROIDAB in the last 72 hours. Anemia Panel: No results for input(s): VITAMINB12, FOLATE, FERRITIN, TIBC, IRON, RETICCTPCT in the last 72 hours. Urine analysis:    Component Value Date/Time   COLORURINE YELLOW 05/14/2018 [place] 05/14/2018 1658   LABSPEC <1.005 (L) 05/14/2018 1658   PHURINE 6.0 05/14/2018 1658   GLUCOSEU NEGATIVE 05/14/2018 1658   HGBUR NEGATIVE 05/14/2018 1658   BILIRUBINUR NEGATIVE 05/14/2018 1658   BILIRUBINUR negative 12/20/2015 1423   KETONESUR NEGATIVE 05/14/2018 1658   PROTEINUR NEGATIVE 05/14/2018 1658   UROBILINOGEN 0.2 12/20/2015 1423   NITRITE NEGATIVE 05/14/2018 1658   LEUKOCYTESUR NEGATIVE 05/14/2018 1658   Sepsis Labs: @LABRCNTIP (procalcitonin:4,lacticidven:4) )No results found for this or any previous visit (from the past 240 hour(s)).   Radiological Exams on Admission: Dg Chest 2 View  Result Date: 05/14/2018 CLINICAL DATA:  84 year-old female c/o dizziness since yesterday when she woke up. Worse with movement. Sent from PCP. Denies chest pain, SOB EXAM: CHEST - 2 VIEW COMPARISON:  None. FINDINGS: Heart size is normal. The lungs are free of focal consolidations and pleural effusions. No pulmonary edema. There are possible pulmonary nodules in the LEFT UPPER lobe and LEFT mid lung zone. Large thoracic osteophytes are present. Deformity of the RIGHT proximal humerus consistent with remote fracture. IMPRESSION: 1. No focal consolidation or pulmonary edema. 2. Possible pulmonary nodules in the LEFT lung. 3. Consider further evaluation with CT of the chest. Electronically Signed   By: Nolon Nations M.D.   On: 05/14/2018 16:30    EKG: Independently reviewed. Sinus rhythm with ventricular bigeminy.   Assessment/Plan   1. Near syncope  - Presents with 2 days of intermittent lightheadedness and near-syncope  - She had a brief episode of chest pain 1-2 wks ago, but none since  - EKG features ventricular bigeminy, orthostatic blood  pressures normal  - Continue cardiac monitoring, could consider echo though patient is clear she would not want any invasive procedure including pacer  - Continue cardiac monitoring and IVF hydration, supportive care    2. Ventricular bigeminy  - Noted to be in ventricular bigeminy  - BP has remained stable  - She reports a brief episode of chest pain 1-2 weeks ago, none now and troponin undetectable  - Electrolytes wnl   - Patient is clear that she would not want any invasive procedure  - Could consider beta-blocker or CCB, though she has been intermittently bradycardic as low as 30's  - ED physician discussed with cardiology and IVF hydration was recommended, will hydrate    DVT prophylaxis: Lovenox  Code Status: DNR  Family Communication: Discussed with patient  Consults called: ED physician discussed case with cardiology  Admission status: Observation     Vianne Bulls, MD [facility] Pager [phone]  If 7PM-7AM, please contact night-coverage [email or web address] Password [facility]  05/14/2018, 10:46 PM

## 2018-05-14 NOTE — ED Triage Notes (Signed)
Dizziness since yesterday when she woke up. Worse with movement. Sent from PCP. Denies chest pain, SOB

## 2018-05-14 NOTE — ED Provider Notes (Signed)
[facility] EMERGENCY DEPARTMENT Provider Note   CSN: [id number] Arrival date & time: 05/14/18  1532     History   Chief Complaint Chief Complaint  Patient presents with  . Dizziness  . Bradycardia    HPI Rebecca Munoz is a 84 y.o. female hx of breast cancer in remission, here presenting with dizziness.  Patient states that she feels lightheaded and dizzy as if she is going to pass out since yesterday.  Patient denies any chest pain or shortness of breath.  Patient denies any palpitations.  Went to urgent care was noted to be in ventricular bigeminy and sent here for evaluation.  Patient denies any previous history of arrhythmias denies any recent travel.  Patient denies any cardiac disease. Patient not on beta blockers or calcium channel blockers   The history is provided by the patient.    Past Medical History:  Diagnosis Date  . Cancer ([place])    breast  . Osteoporosis     There are no active problems to display for this patient.   Past Surgical History:  Procedure Laterality Date  . APPENDECTOMY    . BACK SURGERY    . BREAST SURGERY Right    lumpectomy   . EYE SURGERY    . JOINT REPLACEMENT       OB History   None      Home Medications    Prior to Admission medications   Medication Sig Start Date End Date Taking? Authorizing Provider  acetaminophen (TYLENOL) 500 MG tablet Take 1 tablet (500 mg total) by mouth every 6 (six) hours as needed. 06/18/17   Kinnie Feil, PA-C  aspirin 81 MG tablet Take 81 mg by mouth daily.    [provider]  CALCIUM PO Take 1 tablet by mouth daily.    [provider]  loratadine (CLARITIN) 10 MG tablet TAKE 1 TABLET BY MOUTH EVERY DAY 01/18/18   Tenna Delaine D, PA-C  Melatonin-Pyridoxine (MELATIN PO) Take by mouth at bedtime as needed.    [provider]  Multiple Vitamins-Minerals (MULTIVITAMIN WITH MINERALS) tablet Take 1 tablet by mouth daily.    [provider]    mupirocin ointment (BACTROBAN) 2 % Apply 1 application topically 3 (three) times daily. 03/24/18   Tenna Delaine D, PA-C  omeprazole (PRILOSEC) 20 MG capsule Take 1 capsule (20 mg total) by mouth daily as needed. Patient taking differently: Take 20 mg by mouth daily as needed (reflux).  04/27/17   Tenna Delaine D, PA-C  sucralfate (CARAFATE) 1 g tablet Take 1 tablet (1 g total) by mouth daily with breakfast. 10/07/17   Tenna Delaine D, PA-C  TURMERIC PO Take by mouth daily.    [provider]    Family History No family history on file.  Social History Social History   Tobacco Use  . Smoking status: Never Smoker  . Smokeless tobacco: Never Used  Substance Use Topics  . Alcohol use: No    Alcohol/week: 0.0 oz  . Drug use: No     Allergies   Patient has no known allergies.   Review of Systems Review of Systems  Neurological: Positive for dizziness.  All other systems reviewed and are negative.    Physical Exam Updated Vital Signs BP (!) 156/57   Pulse (!) 41   Temp 98.6 F (37 C) (Oral)   Resp 19   SpO2 98%   Physical Exam  Constitutional: She is oriented to person, place, and time.  Well appearing for age   HENT:  Head: Normocephalic.  Mouth/Throat: Oropharynx is clear and moist.  Eyes: Pupils are equal, round, and reactive to light. Conjunctivae and EOM are normal.  Neck: Normal range of motion. Neck supple.  Cardiovascular: Normal heart sounds.  Bradycardic, irregular   Pulmonary/Chest: Effort normal and breath sounds normal. No stridor. No respiratory distress.  Abdominal: Soft. Bowel sounds are normal. She exhibits no distension. There is no tenderness. There is no guarding.  Musculoskeletal: Normal range of motion.  Neurological: She is alert and oriented to person, place, and time.  Skin: Skin is warm.  Psychiatric: She has a normal mood and affect.  Nursing note and vitals reviewed.    ED Treatments / Results  Labs (all labs  ordered are listed, but only abnormal results are displayed) Labs Reviewed  URINALYSIS, ROUTINE W REFLEX MICROSCOPIC  CBC WITH DIFFERENTIAL/PLATELET  COMPREHENSIVE METABOLIC PANEL  TROPONIN I    EKG None  Radiology No results found.  Procedures Procedures (including critical care time)  CRITICAL CARE Performed by: Wandra Arthurs   Total critical care time: 30 minutes  Critical care time was exclusive of separately billable procedures and treating other patients.  Critical care was necessary to treat or prevent imminent or life-threatening deterioration.  Critical care was time spent personally by me on the following activities: development of treatment plan with patient and/or surrogate as well as nursing, discussions with consultants, evaluation of patient's response to treatment, examination of patient, obtaining history from patient or surrogate, ordering and performing treatments and interventions, ordering and review of laboratory studies, ordering and review of radiographic studies, pulse oximetry and re-evaluation of patient's condition.   Medications Ordered in ED Medications - No data to display   Initial Impression / Assessment and Plan / ED Course  I have reviewed the triage vital signs and the nursing notes.  Pertinent labs & imaging results that were available during my care of the patient were reviewed by me and considered in my medical decision making (see chart for details).     Rebecca Munoz is a 84 y.o. female here with bradycardia. Does go in and out of ventricular bigeminy. Her blood pressure is stable and has good mental status and normal neuro exam. I think likely symptomatic bradycardia. Will get labs, trop, CXR. Will consult cardiology.   5:30 pm Patient's labs unremarkable. I called Dr. Paticia Stack from cardiology. He states that she might be in bigeminy previously and try some IVF and if not improved, admit to hospitalist service. Cardiology will only  see if patient goes into more dangerous rhythms   7:18 PM HR still 30s-40s despite IVF, still in bigeminy. Will admit for observation for symptomatic bradycardia.    Final Clinical Impressions(s) / ED Diagnoses   Final diagnoses:  None    ED Discharge Orders    None       Drenda Freeze, MD 05/14/18 1919

## 2018-05-15 DIAGNOSIS — R55 Syncope and collapse: Secondary | ICD-10-CM | POA: Diagnosis not present

## 2018-05-15 DIAGNOSIS — R42 Dizziness and giddiness: Secondary | ICD-10-CM | POA: Diagnosis not present

## 2018-05-15 DIAGNOSIS — I499 Cardiac arrhythmia, unspecified: Secondary | ICD-10-CM | POA: Diagnosis not present

## 2018-05-15 LAB — BASIC METABOLIC PANEL
ANION GAP: 7 (ref 5–15)
BUN: 12 mg/dL (ref 8–23)
CALCIUM: 8.7 mg/dL — AB (ref 8.9–10.3)
CO2: 24 mmol/L (ref 22–32)
Chloride: 111 mmol/L (ref 98–111)
Creatinine, Ser: 0.73 mg/dL (ref 0.44–1.00)
GFR calc non Af Amer: >60 mL/min (ref >60)
Glucose, Bld: 87 mg/dL (ref 70–99)
POTASSIUM: 4.2 mmol/L (ref 3.5–5.1)
SODIUM: 142 mmol/L (ref 135–145)

## 2018-05-15 MED ORDER — TRIAMCINOLONE ACETONIDE 55 MCG/ACT NA AERO: 1.0000 | INHALATION_SPRAY | Freq: Two times a day (BID) | NASAL | 0 refills | Status: DC

## 2018-05-15 MED ORDER — OMEPRAZOLE 20 MG PO CPDR: 20.0000 mg | DELAYED_RELEASE_CAPSULE | Freq: Every day | ORAL | Status: DC | PRN

## 2018-05-15 NOTE — Evaluation (Signed)
Physical Therapy Evaluation Patient Details Name: Zavia Pullen MRN: [id number] DOB: 1921-10-07 Today's Date: 05/15/2018   History of Present Illness  84 y.o. female with medical history significant for history of breast cancer status post lumpectomy, now presenting to the emergency department for evaluation of lightheadedness and near syncope.  Clinical Impression  Pt appears at baseline level of functioning and is mod I with rollator.  Pt reports no dizziness or lightheadedness during ambulation.  Pt's family present and state they feel safe with pt to d/c home at this functional level.  Pt/family given recommendations for daily exercise and continued physical activity to maintain strength.  Pt reports she has access to physical therapists at her independent living facility.  Pt with no further acute PT needs identified at this time.    Follow Up Recommendations No PT follow up    Equipment Recommendations  None recommended by PT    Recommendations for Other Services       Precautions / Restrictions Precautions Precautions: Fall Restrictions Weight Bearing Restrictions: No      Mobility  Bed Mobility Overal bed mobility: Independent                Transfers Overall transfer level: Modified independent Equipment used: 4-wheeled walker                Ambulation/Gait Ambulation/Gait assistance: Modified independent (Device/Increase time) Gait Distance (Feet): 150 Feet Assistive device: 4-wheeled walker   Gait velocity: WFL      Stairs            Wheelchair Mobility    Modified Rankin (Stroke Patients Only)       Balance Overall balance assessment: Modified Independent(requires rollator for balance with all mobility)                                           Pertinent Vitals/Pain Pain Assessment: No/denies pain    Home Living Family/patient expects to be discharged to:: Private residence Living Arrangements:  Alone(independent living) Available Help at Discharge: Available 24 hours/day Type of Home: Independent living facility Home Access: Level entry     Home Layout: One level Home Equipment: Environmental consultant - 4 wheels      Prior Function Level of Independence: Independent with assistive device(s)         Comments: uses rollator at all times     Hand Dominance        Extremity/Trunk Assessment   Upper Extremity Assessment Upper Extremity Assessment: Generalized weakness    Lower Extremity Assessment Lower Extremity Assessment: Generalized weakness    Cervical / Trunk Assessment Cervical / Trunk Assessment: Kyphotic  Communication   Communication: No difficulties  Cognition Arousal/Alertness: Awake/alert Behavior During Therapy: WFL for tasks assessed/performed Overall Cognitive Status: Within Functional Limits for tasks assessed                                        General Comments      Exercises     Assessment/Plan    PT Assessment Patent does not need any further PT services  PT Problem List         PT Treatment Interventions      PT Goals (Current goals can be found in the Care Plan section)  Acute Rehab PT Goals PT  Goal Formulation: All assessment and education complete, DC therapy    Frequency     Barriers to discharge        Co-evaluation               AM-PAC PT "6 Clicks" Daily Activity  Outcome Measure Difficulty turning over in bed (including adjusting bedclothes, sheets and blankets)?: None Difficulty moving from lying on back to sitting on the side of the bed? : None Difficulty sitting down on and standing up from a chair with arms (e.g., wheelchair, bedside commode, etc,.)?: A Little Help needed moving to and from a bed to chair (including a wheelchair)?: A Little Help needed walking in hospital room?: A Little Help needed climbing 3-5 steps with a railing? : A Little 6 Click Score: 20    End of Session    Activity Tolerance: Patient tolerated treatment well Patient left: in bed;with call bell/phone within reach;with family/visitor present Nurse Communication: Mobility status      Time: 1215-1230 PT Time Calculation (min) (ACUTE ONLY): 15 min   Charges:   PT Evaluation $PT Eval Low Complexity: [place], PT, DPT  DONAWERTH,KAREN 05/15/2018, 12:47 PM

## 2018-05-15 NOTE — Discharge Summary (Signed)
Physician Discharge Summary  Rebecca Munoz KTG:[id number] DOB: Jan 16, 1921 DOA: 05/14/2018  PCP: Leonie Douglas, PA-C  Admit date: 05/14/2018 Discharge date: 05/15/2018  Admitted From: home Discharge disposition: home   Recommendations for Outpatient Follow-Up:   1. check TSH 2. If patient continues to have symptoms and agreeable to work up-- echo and cardiology referral-- not sure she could tolerate BB 3. Follow up lung nodule if patient so desires   Discharge Diagnosis:   Principal Problem:   Near syncope Active Problems:   Bigeminy    Discharge Condition: Improved.  Diet recommendation:Regular.  Wound care: None.  Code status: Full.   History of Present Illness:   Rebecca Munoz is a 84 y.o. female with medical history significant for history of breast cancer status post lumpectomy, now presenting to the emergency department for evaluation of lightheadedness and near syncope.  Patient reports that she had a brief episode of chest pain 1 to 2 weeks ago that resolved spontaneously.  She went on to be in her usual state of health until the morning of 05/13/2018 when she developed lightheadedness, worse when she was standing and active.  There was no associated chest pain or palpitations with this and she denies any recent fevers, chills, or shortness of breath.  She has felt as though she would lose consciousness with some of these episodes, but denies any actual loss of consciousness, fall, or injury.     Hospital Course by Problem:   Near syncope  - Presents with 2 days of intermittent lightheadedness and near-syncope  - She had a brief episode of chest pain 1-2 wks ago, but none since  - EKG features ventricular bigeminy, orthostatic blood pressures normal  - hydration  Ventricular bigeminy  - Noted to be in ventricular bigeminy  - BP has remained stable  - ED physician discussed with cardiology and IVF hydration was recommended, will hydrate    Symptoms resolved Walked with PT     Medical Consultants:      Discharge Exam:   Vitals:   05/15/18 1142 05/15/18 1230  BP: (!) 118/93   Pulse: 68   Resp: 20   Temp: 98.4 F (36.9 C)   SpO2:  96%   Vitals:   05/15/18 0338 05/15/18 0339 05/15/18 1142 05/15/18 1230  BP: (!) 123/54 (!) 123/54 (!) 118/93   Pulse: 62 62 68   Resp: 18 18 20    Temp: 98.2 F (36.8 C) 98.2 F (36.8 C) 98.4 F (36.9 C)   TempSrc: Oral Oral Oral   SpO2: 96%   96%  Weight:  58.6 kg (129 lb 3.2 oz)    Height:        General exam: Appears calm and comfortable.- no further symptoms, anxious to go home   The results of significant diagnostics from this hospitalization (including imaging, microbiology, ancillary and laboratory) are listed below for reference.     Procedures and Diagnostic Studies:   Dg Chest 2 View  Result Date: 05/14/2018 CLINICAL DATA:  84 year-old female c/o dizziness since yesterday when she woke up. Worse with movement. Sent from PCP. Denies chest pain, SOB EXAM: CHEST - 2 VIEW COMPARISON:  None. FINDINGS: Heart size is normal. The lungs are free of focal consolidations and pleural effusions. No pulmonary edema. There are possible pulmonary nodules in the LEFT UPPER lobe and LEFT mid lung zone. Large thoracic osteophytes are present. Deformity of the RIGHT proximal humerus consistent with remote fracture. IMPRESSION: 1. No focal  consolidation or pulmonary edema. 2. Possible pulmonary nodules in the LEFT lung. 3. Consider further evaluation with CT of the chest. Electronically Signed   By: Nolon Nations M.D.   On: 05/14/2018 16:30     Labs:   Basic Metabolic Panel: Recent Labs  Lab 05/14/18 1608 05/15/18 0557  NA 141 142  K 4.0 4.2  CL 108 111  CO2 26 24  GLUCOSE 87 87  BUN 19 12  CREATININE 0.73 0.73  CALCIUM 9.8 8.7*  MG 2.4  --   PHOS 3.9  --    GFR Estimated Creatinine Clearance: 38.9 mL/min (by C-G formula based on SCr of 0.73 mg/dL). Liver  Function Tests: Recent Labs  Lab 05/14/18 1608  AST 23  ALT 23  ALKPHOS 114  BILITOT 0.5  PROT 6.8  ALBUMIN 3.9   No results for input(s): LIPASE, AMYLASE in the last 168 hours. No results for input(s): AMMONIA in the last 168 hours. Coagulation profile No results for input(s): INR, PROTIME in the last 168 hours.  CBC: Recent Labs  Lab 05/14/18 1608  WBC 5.9  NEUTROABS 2.6  HGB 13.2  HCT 39.4  MCV 91.8  PLT 187   Cardiac Enzymes: Recent Labs  Lab 05/14/18 1608  TROPONINI <0.03   BNP: Invalid input(s): POCBNP CBG: No results for input(s): GLUCAP in the last 168 hours. D-Dimer No results for input(s): DDIMER in the last 72 hours. Hgb A1c No results for input(s): HGBA1C in the last 72 hours. Lipid Profile No results for input(s): CHOL, HDL, LDLCALC, TRIG, CHOLHDL, LDLDIRECT in the last 72 hours. Thyroid function studies No results for input(s): TSH, T4TOTAL, T3FREE, THYROIDAB in the last 72 hours.  Invalid input(s): FREET3 Anemia work up No results for input(s): VITAMINB12, FOLATE, FERRITIN, TIBC, IRON, RETICCTPCT in the last 72 hours. Microbiology No results found for this or any previous visit (from the past 240 hour(s)).   Discharge Instructions:   Discharge Instructions    Diet general   Complete by:  As directed    Discharge instructions   Complete by:  As directed    If continues to have symptoms and agreeable to evaluations, can have echo and further work up.  There are medications that can help as well   Increase activity slowly   Complete by:  As directed      Allergies as of 05/15/2018   No Known Allergies     Medication List    TAKE these medications   acetaminophen 500 MG tablet Commonly known as:  TYLENOL Take 1 tablet (500 mg total) by mouth every 6 (six) hours as needed.   aspirin 81 MG tablet Take 81 mg by mouth daily.   CALCIUM PO Take 1 tablet by mouth daily.   loratadine 10 MG tablet Commonly known as:  CLARITIN TAKE 1  TABLET BY MOUTH EVERY DAY   MELATIN PO Take by mouth at bedtime as needed.   multivitamin with minerals tablet Take 1 tablet by mouth daily.   mupirocin ointment 2 % Commonly known as:  BACTROBAN Apply 1 application topically 3 (three) times daily.   omeprazole 20 MG capsule Commonly known as:  PRILOSEC Take 1 capsule (20 mg total) by mouth daily as needed (reflux).   sucralfate 1 g tablet Commonly known as:  CARAFATE Take 1 tablet (1 g total) by mouth daily with breakfast.   triamcinolone 55 MCG/ACT Aero nasal inhaler Commonly known as:  NASACORT Place 1 spray into the nose 2 (two) times  daily.   TURMERIC PO Take by mouth daily.      Follow-up Information    Leonie Douglas, PA-C. Go on 05/24/2018.   Specialties:  Physician Assistant, Family Medicine Why:  end of week for symptom check@1 :40pm Contact information: [place] [address] [phone]            Time coordinating discharge: 25 min  Signed:  Geradine Girt  [facility] 05/15/2018, 2:00 PM

## 2018-05-15 NOTE — Care Management Note (Signed)
Case Management Note  Patient Details  Name: Rebecca Munoz MRN: [id number] Date of Birth: [birth year]  Subjective/Objective:  Near Syncope                Action/Plan: Patient resides at [facility];  PCP: Leonie Douglas, PA; has private insurance with Medicare; patient does not remember what pharmacy she use, stated that her son takes care of all of that; she goes to [facility] for rehab; DME - Rollater ( walker with seat) at home; CM will continue to follow for progression of care.  Expected Discharge Date:    possibly 05/16/2018              Expected Discharge Plan:   Home  Status of Service:   In progress  Sherrilyn Rist [phone] 05/15/2018, 11:49 AM

## 2018-05-17 ENCOUNTER — Other Ambulatory Visit: Payer: Self-pay

## 2018-05-17 ENCOUNTER — Encounter: Payer: Self-pay | Admitting: Physician Assistant

## 2018-05-17 ENCOUNTER — Ambulatory Visit (INDEPENDENT_AMBULATORY_CARE_PROVIDER_SITE_OTHER): Payer: Medicare Other | Admitting: Physician Assistant

## 2018-05-17 VITALS — BP 108/72 | HR 63 | Temp 98.5°F | Resp 16 | Ht 66.0 in | Wt 130.0 lb

## 2018-05-17 DIAGNOSIS — I499 Cardiac arrhythmia, unspecified: Secondary | ICD-10-CM

## 2018-05-17 DIAGNOSIS — Z09 Encounter for follow-up examination after completed treatment for conditions other than malignant neoplasm: Secondary | ICD-10-CM

## 2018-05-17 DIAGNOSIS — R9389 Abnormal findings on diagnostic imaging of other specified body structures: Secondary | ICD-10-CM

## 2018-05-17 DIAGNOSIS — R0789 Other chest pain: Secondary | ICD-10-CM | POA: Diagnosis not present

## 2018-05-17 DIAGNOSIS — I498 Other specified cardiac arrhythmias: Secondary | ICD-10-CM

## 2018-05-17 NOTE — Progress Notes (Signed)
Rebecca Munoz  MRN: [id number] DOB: [birth year]  Subjective:  Rebecca Munoz is a 84 y.o. female seen in office today for a chief complaint of hospital follow up.  Admitted on 05/14/2018 for symptomatic bradycardia.  Discharged 05/15/2018.  She presented to the ED with lightheadedness and near syncope.  Heart rate was in the 30s.  EKG showed ventricular bigeminy.  Given hydration.  Symptoms resolved.  Discharge summary recommended outpatient involvement of checking TSH, considering echo and cardiology referral if symptoms persist, and following up on incidental finding of lung nodule the patient desires.  Today, she is accompanied by her daughter.  Reports she is feeling better since her hospital visit.  She has not had any dizziness or near syncope since discharge.  Did have an incident of her chest feeling slightly heavy this morning.  "Like I pulled a muscle." Different from what it has felt like since hospital visit. Has some new nausea. Denies chest pain, palpitations, vomiting, abdominal pain, SOB, DOE, vomiting, orthopnea, diaphoresis, lower leg swelling, and cough.Had brief episode of chest pain 2 weeks ago, that resolved and never returned.  Performs her daily activity with no issues.  Denies heat intolerance, cold intolerance, constipation, diarrhea, and dry skin.  In terms of incisional lung nodule, she does not want any further work-up at this time but would like to discuss this with her son.  She is willing to meet with a cardiologist.  As of now, she would not proceed with any cardiovascular procedure.  May consider having an echo.  Agrees to checking TSH.  Denies smoking.     Review of Systems Per HPI  Patient Active Problem List   Diagnosis Date Noted  . Bigeminy 05/14/2018  . Near syncope 05/14/2018    Current Outpatient Medications on File Prior to Visit  Medication Sig Dispense Refill  . acetaminophen (TYLENOL) 500 MG tablet Take 1 tablet (500 mg total) by mouth every 6 (six)  hours as needed. 30 tablet 0  . aspirin 81 MG tablet Take 81 mg by mouth daily.    Marland Kitchen CALCIUM PO Take 1 tablet by mouth daily.    Marland Kitchen loratadine (CLARITIN) 10 MG tablet TAKE 1 TABLET BY MOUTH EVERY DAY 90 tablet 0  . Melatonin-Pyridoxine (MELATIN PO) Take by mouth at bedtime as needed.    . Multiple Vitamins-Minerals (MULTIVITAMIN WITH MINERALS) tablet Take 1 tablet by mouth daily.    . mupirocin ointment (BACTROBAN) 2 % Apply 1 application topically 3 (three) times daily. 22 g 0  . omeprazole (PRILOSEC) 20 MG capsule Take 1 capsule (20 mg total) by mouth daily as needed (reflux).    . sucralfate (CARAFATE) 1 g tablet Take 1 tablet (1 g total) by mouth daily with breakfast. 60 tablet 3  . triamcinolone (NASACORT) 55 MCG/ACT AERO nasal inhaler Place 1 spray into the nose 2 (two) times daily. 1 Inhaler 0  . TURMERIC PO Take by mouth daily.     No current facility-administered medications on file prior to visit.     No Known Allergies    Social History   Socioeconomic History  . Marital status: Widowed    Spouse name: Not on file  . Number of children: Not on file  . Years of education: Not on file  . Highest education level: Not on file  Occupational History  . Not on file  Social Needs  . Financial resource strain: Not on file  . Food insecurity:    Worry: Not on file  Inability: Not on file  . Transportation needs:    Medical: Not on file    Non-medical: Not on file  Tobacco Use  . Smoking status: Never Smoker  . Smokeless tobacco: Never Used  Substance and Sexual Activity  . Alcohol use: No    Alcohol/week: 0.0 standard drinks  . Drug use: No  . Sexual activity: Not on file  Lifestyle  . Physical activity:    Days per week: Not on file    Minutes per session: Not on file  . Stress: Not on file  Relationships  . Social connections:    Talks on phone: Not on file    Gets together: Not on file    Attends religious service: Not on file    Active member of club or  organization: Not on file    Attends meetings of clubs or organizations: Not on file    Relationship status: Not on file  . Intimate partner violence:    Fear of current or ex partner: Not on file    Emotionally abused: Not on file    Physically abused: Not on file    Forced sexual activity: Not on file  Other Topics Concern  . Not on file  Social History Narrative  . Not on file    Objective:  BP 108/72   Pulse (!) 52   Temp 98.5 F (36.9 C)   Resp 16   Ht 5\' 6"  (1.676 m)   Wt 130 lb (59 kg)   SpO2 95%   BMI 20.98 kg/m   Physical Exam  Constitutional: She is oriented to person, place, and time. She appears well-developed and well-nourished. No distress.  HENT:  Head: Normocephalic and atraumatic.  Eyes: Conjunctivae are normal.  Neck: Normal range of motion.  Cardiovascular: Normal rate, regular rhythm, normal heart sounds and intact distal pulses.  Pulmonary/Chest: Effort normal and breath sounds normal. She has no decreased breath sounds. She has no wheezes. She has no rhonchi. She exhibits no tenderness.  Musculoskeletal:       Right lower leg: She exhibits no edema.       Left lower leg: She exhibits no edema.  Neurological: She is alert and oriented to person, place, and time.  Skin: Skin is warm and dry.  Psychiatric: She has a normal mood and affect.  Vitals reviewed.   BP Readings from Last 3 Encounters:  05/17/18 108/72  05/15/18 (!) 118/93  03/24/18 (!) 113/57   Pulse Readings from Last 3 Encounters:  05/17/18 (!) 52  05/15/18 68  03/24/18 67   EKG shows NSR with rate of 63 bpm. PR and QRS intervals within normal limits. Findings presented and discussed with Dr. Nolon Rod.   Assessment and Plan :  1. Chest heaviness Patient is overall well-appearing, no acute distress.  Vitals stable.  Currently asymptomatic.  EKG is normal today.  No ventricular bigeminy noted.  Patient is willing to meet with a cardiologist but does not want to undergo any  invasive procedures.  Heart rate in office was initially 52 but did increase to the 60-80s bpm.  Referral to cardiology placed.  We will follow-up in 1 week for reevaluation of symptoms.  Follow-up sooner if needed.  Given strict ED precautions. - EKG 12-Lead 2. Bigeminy TSH pending. - TSH - Ambulatory referral to Cardiology  3. Abnormal x-ray Chest x-ray during admission showed possible pulmonary nodules in the left lung.  Recommended further evaluation with CT of the chest if patient desires.  As of now, patient does not desire further evaluation.  Will discuss this with son and let me know at the follow-up visit.  4. Hypocalcemia Last BMP in the hospital 3 days ago with borderline low calcium 8.7.  Will recheck today. - Basic metabolic panel  5. Hospital discharge follow-up    Tenna Delaine PA-C  [facility] 05/17/2018 11:46 AM

## 2018-05-17 NOTE — Patient Instructions (Addendum)
  We have checked your thyroid function today and should have those results within one week. I have placed a referral to cardiology and they should contact you within 2 weeks. Follow up as needed. Thank you for letting me participate in your health and well being.    IF you received an x-ray today, you will receive an invoice from [facility]. Please contact [facility] at [phone] with questions or concerns regarding your invoice.   IF you received labwork today, you will receive an invoice from [facility]. Please contact [facility] at [phone] with questions or concerns regarding your invoice.   Our billing staff will not be able to assist you with questions regarding bills from these companies.  You will be contacted with the lab results as soon as they are available. The fastest way to get your results is to activate your My Chart account. Instructions are located on the last page of this paperwork. If you have not heard from [place] regarding the results in 2 weeks, please contact this office.

## 2018-05-18 ENCOUNTER — Encounter: Payer: Self-pay | Admitting: Physician Assistant

## 2018-05-18 LAB — BASIC METABOLIC PANEL
BUN / CREAT RATIO: 20 (ref 12–28)
BUN: 15 mg/dL (ref 10–36)
CHLORIDE: 103 mmol/L (ref 96–106)
CO2: 24 mmol/L (ref 20–29)
Calcium: 9.7 mg/dL (ref 8.7–10.3)
Creatinine, Ser: 0.74 mg/dL (ref 0.57–1.00)
GFR calc Af Amer: 80 mL/min/{1.73_m2} (ref >59)
GFR calc non Af Amer: 69 mL/min/{1.73_m2} (ref >59)
GLUCOSE: 81 mg/dL (ref 65–99)
Potassium: 4.6 mmol/L (ref 3.5–5.2)
Sodium: 141 mmol/L (ref 134–144)

## 2018-05-18 LAB — TSH: TSH: 2.23 u[IU]/mL (ref 0.450–4.500)

## 2018-05-26 ENCOUNTER — Ambulatory Visit (INDEPENDENT_AMBULATORY_CARE_PROVIDER_SITE_OTHER): Payer: Medicare Other | Admitting: Physician Assistant

## 2018-05-26 ENCOUNTER — Encounter: Payer: Self-pay | Admitting: Physician Assistant

## 2018-05-26 ENCOUNTER — Other Ambulatory Visit: Payer: Self-pay

## 2018-05-26 VITALS — BP 108/67 | HR 64 | Temp 99.0°F | Resp 18 | Ht 64.84 in | Wt 131.4 lb

## 2018-05-26 DIAGNOSIS — Z515 Encounter for palliative care: Secondary | ICD-10-CM

## 2018-05-26 DIAGNOSIS — I498 Other specified cardiac arrhythmias: Secondary | ICD-10-CM

## 2018-05-26 DIAGNOSIS — I499 Cardiac arrhythmia, unspecified: Secondary | ICD-10-CM

## 2018-05-26 NOTE — Progress Notes (Signed)
Rebecca Munoz  MRN: [id number] DOB: September 08, 1921  Subjective:  Rebecca Munoz is a 84 y.o. female seen in office today for a chief complaint of f/u on lab work and to discuss end of life plans. She is accompanied by son.   Had TSH drawn last week after hospital d/c. Since last visit, she has been feeling "okay." She is feeling more tired in the mornings. Will often worry about her friends. Her best friend recently passed away. Has not called to schedule appointment with cardiology as she does not think she wants any further workup regarding her episode of ventricular bigeminy.   Patient's son would like to make sure that everything is up-to-date in the computer system for patient's end-of-life wishes.  Her son is her power of attorney.  She wishes to be DNR.  Has living well.  Healthcare POA document has been scanned into the system.  Also whole-body donation form has been scanned into the system.  She wishes to donate to [facility] but is also considering [employer].  She has lived a long healthy life and is ready to meet Jesus.  She feels as if the time is near.  Her husband began having decreased appetite whenever it was near his time to go and she has started to not have much of an appetite anymore.  Overall, she feels well but believes it is time.  Son is at peace with this decision.  No other questions or concerns today.  Review of Systems  Constitutional: Negative for chills, diaphoresis and fever.  Respiratory: Negative for shortness of breath.   Cardiovascular: Negative for chest pain and palpitations.  Neurological: Negative for dizziness and light-headedness.    Patient Active Problem List   Diagnosis Date Noted  . Bigeminy 05/14/2018  . Near syncope 05/14/2018    Current Outpatient Medications on File Prior to Visit  Medication Sig Dispense Refill  . acetaminophen (TYLENOL) 500 MG tablet Take 1 tablet (500 mg total) by mouth every 6 (six) hours as needed. 30 tablet 0  . aspirin  81 MG tablet Take 81 mg by mouth daily.    Marland Kitchen CALCIUM PO Take 1 tablet by mouth daily.    Marland Kitchen loratadine (CLARITIN) 10 MG tablet TAKE 1 TABLET BY MOUTH EVERY DAY 90 tablet 0  . Melatonin-Pyridoxine (MELATIN PO) Take by mouth at bedtime as needed.    . Multiple Vitamins-Minerals (MULTIVITAMIN WITH MINERALS) tablet Take 1 tablet by mouth daily.    . mupirocin ointment (BACTROBAN) 2 % Apply 1 application topically 3 (three) times daily. 22 g 0  . omeprazole (PRILOSEC) 20 MG capsule Take 1 capsule (20 mg total) by mouth daily as needed (reflux).    . sucralfate (CARAFATE) 1 g tablet Take 1 tablet (1 g total) by mouth daily with breakfast. 60 tablet 3  . triamcinolone (NASACORT) 55 MCG/ACT AERO nasal inhaler Place 1 spray into the nose 2 (two) times daily. 1 Inhaler 0  . TURMERIC PO Take by mouth daily.     No current facility-administered medications on file prior to visit.     No Known Allergies   Objective:  BP 108/67   Pulse 64   Temp 99 F (37.2 C) (Oral)   Resp 18   Ht 5' 4.84" (1.647 m)   Wt 131 lb 6.4 oz (59.6 kg)   SpO2 95%   BMI 21.97 kg/m    Physical Exam  Constitutional: She is oriented to person, place, and time. She appears well-developed and well-nourished.  No distress.  HENT:  Head: Normocephalic and atraumatic.  Mouth/Throat: Uvula is midline, oropharynx is clear and moist and mucous membranes are normal.  Eyes: Pupils are equal, round, and reactive to light. Conjunctivae and EOM are normal.  Neck: Normal range of motion.  Cardiovascular: Normal rate, regular rhythm, normal heart sounds and intact distal pulses.  Pulmonary/Chest: Effort normal and breath sounds normal. She has no wheezes. She has no rhonchi. She has no rales.  Musculoskeletal:       Right lower leg: She exhibits no swelling.       Left lower leg: She exhibits no swelling.  Neurological: She is alert and oriented to person, place, and time.  Skin: Skin is warm and dry.  Psychiatric: She has a normal  mood and affect.  Vitals reviewed.   Assessment and Plan :  1. Bigeminy 2. End of life care Most recent TSH and BMP were normal.  Discussed with patient.  She does not wish to pursue any further cardiac work-up at this time.  Informed patient that if she changes her mind all she is to call the cardiology office to schedule appointment.  She voices her understanding.  We discussed end-of-life care in depth today.  CODE STATUS is DNR.  Advised to follow-up as needed.   Tenna Delaine PA-C  [facility] 05/26/2018 8:59 PM

## 2018-05-26 NOTE — Patient Instructions (Signed)
° ° ° °  If you have lab work done today you will be contacted with your lab results within the next 2 weeks.  If you have not heard from us then please contact us. The fastest way to get your results is to register for My Chart. ° ° °IF you received an x-ray today, you will receive an invoice from [facility]. Please contact [facility] at [phone] with questions or concerns regarding your invoice.  ° °IF you received labwork today, you will receive an invoice from [facility]. Please contact [facility] at [phone] with questions or concerns regarding your invoice.  ° °Our billing staff will not be able to assist you with questions regarding bills from these companies. ° °You will be contacted with the lab results as soon as they are available. The fastest way to get your results is to activate your My Chart account. Instructions are located on the last page of this paperwork. If you have not heard from us regarding the results in 2 weeks, please contact this office. °  ° ° ° °

## 2018-05-30 ENCOUNTER — Telehealth: Payer: Self-pay | Admitting: Physician Assistant

## 2018-05-30 NOTE — Telephone Encounter (Signed)
Copied from [place] [id number]. Topic: General - Other >> May 30, 2018 12:10 PM Yvette Rack wrote: Reason for CRM: Pt son Quillian Quince states pt needs a DNR notice that can be posted in her room. Daniel requests call back. Cb# [phone]

## 2018-05-30 NOTE — Telephone Encounter (Signed)
Please advise 

## 2018-05-30 NOTE — Telephone Encounter (Signed)
We have printed out form.  CMA Vaughan Basta will contact [employer] and have him pick up form.

## 2018-05-31 NOTE — Telephone Encounter (Signed)
I talked with Rebecca Munoz to let her know that the form is ready to be picked up from the front dest at [address].

## 2018-06-15 ENCOUNTER — Other Ambulatory Visit: Payer: Self-pay | Admitting: Physician Assistant

## 2018-06-15 DIAGNOSIS — R1013 Epigastric pain: Secondary | ICD-10-CM

## 2018-06-16 DIAGNOSIS — I493 Ventricular premature depolarization: Secondary | ICD-10-CM | POA: Diagnosis not present

## 2018-06-16 DIAGNOSIS — I491 Atrial premature depolarization: Secondary | ICD-10-CM | POA: Diagnosis not present

## 2018-06-21 DIAGNOSIS — H35372 Puckering of macula, left eye: Secondary | ICD-10-CM | POA: Diagnosis not present

## 2018-06-21 DIAGNOSIS — H353221 Exudative age-related macular degeneration, left eye, with active choroidal neovascularization: Secondary | ICD-10-CM | POA: Diagnosis not present

## 2018-06-21 DIAGNOSIS — H35421 Microcystoid degeneration of retina, right eye: Secondary | ICD-10-CM | POA: Diagnosis not present

## 2018-06-21 DIAGNOSIS — H353213 Exudative age-related macular degeneration, right eye, with inactive scar: Secondary | ICD-10-CM | POA: Diagnosis not present

## 2018-07-17 DIAGNOSIS — I493 Ventricular premature depolarization: Secondary | ICD-10-CM | POA: Diagnosis not present

## 2018-07-17 DIAGNOSIS — I491 Atrial premature depolarization: Secondary | ICD-10-CM | POA: Diagnosis not present

## 2018-07-17 DIAGNOSIS — R42 Dizziness and giddiness: Secondary | ICD-10-CM | POA: Diagnosis not present

## 2018-07-26 ENCOUNTER — Ambulatory Visit (INDEPENDENT_AMBULATORY_CARE_PROVIDER_SITE_OTHER): Payer: Medicare Other | Admitting: Physician Assistant

## 2018-07-26 ENCOUNTER — Encounter: Payer: Self-pay | Admitting: Physician Assistant

## 2018-07-26 ENCOUNTER — Other Ambulatory Visit: Payer: Self-pay

## 2018-07-26 VITALS — BP 115/50 | HR 49 | Temp 98.2°F | Resp 18 | Ht 64.84 in | Wt 134.0 lb

## 2018-07-26 DIAGNOSIS — M79604 Pain in right leg: Secondary | ICD-10-CM | POA: Diagnosis not present

## 2018-07-26 DIAGNOSIS — M79605 Pain in left leg: Secondary | ICD-10-CM | POA: Diagnosis not present

## 2018-07-26 DIAGNOSIS — R29898 Other symptoms and signs involving the musculoskeletal system: Secondary | ICD-10-CM | POA: Diagnosis not present

## 2018-07-26 NOTE — Progress Notes (Signed)
Rebecca Munoz  MRN: [id number] DOB: 03/18/1921  Subjective:  Rebecca Munoz is a 84 y.o. female seen in office today for a chief complaint of bilateral hip and leg pain a few days ago when trying to get out of bed.  Took extra strength Tylenol and had immediate relief.  Denies falling.  Denies numbness, tingling, redness, warmth, fever, chills, and weakness.  Has not had any issues since.  For the past year, patient has been using rolling walker which is new for her and not doing her daily exercises as much as normal.  She is otherwise feeling well.  No other questions or concerns today.  Review of Systems Per HPI Patient Active Problem List   Diagnosis Date Noted  . Bigeminy 05/14/2018  . Near syncope 05/14/2018    Current Outpatient Medications on File Prior to Visit  Medication Sig Dispense Refill  . acetaminophen (TYLENOL) 500 MG tablet Take 1 tablet (500 mg total) by mouth every 6 (six) hours as needed. 30 tablet 0  . aspirin 81 MG tablet Take 81 mg by mouth daily.    Marland Kitchen CALCIUM PO Take 1 tablet by mouth daily.    Marland Kitchen loratadine (CLARITIN) 10 MG tablet TAKE 1 TABLET BY MOUTH EVERY DAY 90 tablet 0  . Melatonin-Pyridoxine (MELATIN PO) Take by mouth at bedtime as needed.    . metoprolol tartrate (LOPRESSOR) 25 MG tablet TK 1/2 T PO BID  1  . Multiple Vitamins-Minerals (MULTIVITAMIN WITH MINERALS) tablet Take 1 tablet by mouth daily.    . mupirocin ointment (BACTROBAN) 2 % Apply 1 application topically 3 (three) times daily. 22 g 0  . sucralfate (CARAFATE) 1 g tablet TAKE 1 TABLET BY MOUTH EVERY DAY WITH BREAKFAST 90 tablet 0  . triamcinolone (NASACORT) 55 MCG/ACT AERO nasal inhaler Place 1 spray into the nose 2 (two) times daily. 1 Inhaler 0  . TURMERIC PO Take by mouth daily.    Marland Kitchen omeprazole (PRILOSEC) 20 MG capsule Take 1 capsule (20 mg total) by mouth daily as needed (reflux). (Patient not taking: Reported on 07/26/2018)     No current facility-administered medications on file  prior to visit.     No Known Allergies   Objective:  BP (!) 115/50   Pulse (!) 49   Temp 98.2 F (36.8 C) (Oral)   Resp 18   Ht 5' 4.84" (1.647 m)   Wt 134 lb (60.8 kg)   SpO2 95%   BMI 22.41 kg/m   Physical Exam  Constitutional: She is oriented to person, place, and time. She appears well-developed and well-nourished.  HENT:  Head: Normocephalic and atraumatic.  Eyes: Conjunctivae are normal.  Neck: Normal range of motion.  Cardiovascular:  Pulses:      Dorsalis pedis pulses are 2+ on the right side, and 2+ on the left side.       Posterior tibial pulses are 2+ on the right side, and 2+ on the left side.  Pulmonary/Chest: Effort normal.  Musculoskeletal:       Right hip: Normal. She exhibits normal range of motion, normal strength, no tenderness, no bony tenderness and no swelling.       Left hip: Normal. She exhibits normal range of motion, normal strength, no tenderness, no bony tenderness and no swelling.       Right knee: Normal.       Left knee: Normal.       Right upper leg: Normal. She exhibits no tenderness, no bony tenderness, no swelling  and no edema.       Left upper leg: Normal. She exhibits no tenderness, no bony tenderness, no swelling and no edema.       Right lower leg: Normal. She exhibits no tenderness, no bony tenderness, no swelling and no edema.       Left lower leg: Normal. She exhibits no tenderness, no bony tenderness, no swelling and no edema.  Neurological: She is alert and oriented to person, place, and time.  Uses rolling walker for walking assistance.  Sensation in bilateral lower extremities intact.  Strength of bilateral lower extremities 5/5.  Skin: Skin is warm and dry.  No erythema or warmth of bilateral lower extremities.  No rash noted.  Bilateral calves are equal in measurement.  Psychiatric: She has a normal mood and affect.  Vitals reviewed.   Assessment and Plan :  1. Leg pain, bilateral 2. Muscular deconditioning Patient  completely asymptomatic today.  No acute findings on exam.  Patient has 5 out of 5 strength of bilateral lower extremities.  Neurovascularly intact.  Believe that she may have had some deconditioning in her leg mass over the past year due to use of rolling walker and less participation and daily physical activity.  She has access to physical therapy at independent living home and will reach out to them for balance and strength conditioning.  Educated patient that it is okay for her to take Tylenol as needed as prescribed for pain.  She was very reassured to hear this.  Also discussed fall risk at home.  Encouraged her to follow-up as needed.    Tenna Delaine PA-C  [facility] 07/26/2018 4:04 PM

## 2018-07-26 NOTE — Patient Instructions (Addendum)
It was a pleasure seeing you today.  It is okay to take tylenol pain medication when you have pain.  If you start to have numbness, tingling, redness, or warmth in lower extremities, please let me know immediately.    If you have lab work done today you will be contacted with your lab results within the next 2 weeks.  If you have not heard from [place] then please contact us. The fastest way to get your results is to register for My Chart.   IF you received an x-ray today, you will receive an invoice from [facility]. Please contact [facility] at [phone] with questions or concerns regarding your invoice.   IF you received labwork today, you will receive an invoice from [facility]. Please contact [facility] at [phone] with questions or concerns regarding your invoice.   Our billing staff will not be able to assist you with questions regarding bills from these companies.  You will be contacted with the lab results as soon as they are available. The fastest way to get your results is to activate your My Chart account. Instructions are located on the last page of this paperwork. If you have not heard from [place] regarding the results in 2 weeks, please contact this office.

## 2018-07-28 ENCOUNTER — Encounter: Payer: Self-pay | Admitting: Physician Assistant

## 2018-07-28 DIAGNOSIS — R262 Difficulty in walking, not elsewhere classified: Secondary | ICD-10-CM | POA: Diagnosis not present

## 2018-07-28 DIAGNOSIS — M25552 Pain in left hip: Secondary | ICD-10-CM | POA: Diagnosis not present

## 2018-07-28 DIAGNOSIS — M25551 Pain in right hip: Secondary | ICD-10-CM | POA: Diagnosis not present

## 2018-07-31 DIAGNOSIS — R262 Difficulty in walking, not elsewhere classified: Secondary | ICD-10-CM | POA: Diagnosis not present

## 2018-07-31 DIAGNOSIS — M25552 Pain in left hip: Secondary | ICD-10-CM | POA: Diagnosis not present

## 2018-07-31 DIAGNOSIS — M25551 Pain in right hip: Secondary | ICD-10-CM | POA: Diagnosis not present

## 2018-07-31 IMAGING — CR DG HUMERUS 2V *R*
2 series · 2 of 2 positions shown · non-contrast
Comparison: None.

CLINICAL DATA: Trip and fall injury. Patient brought from [HOSPITAL] with right humeral fracture and right pelvic fracture.

EXAM:
RIGHT HUMERUS - 2+ VIEW; RIGHT SHOULDER - 2+ VIEW

[x humerus lat right]
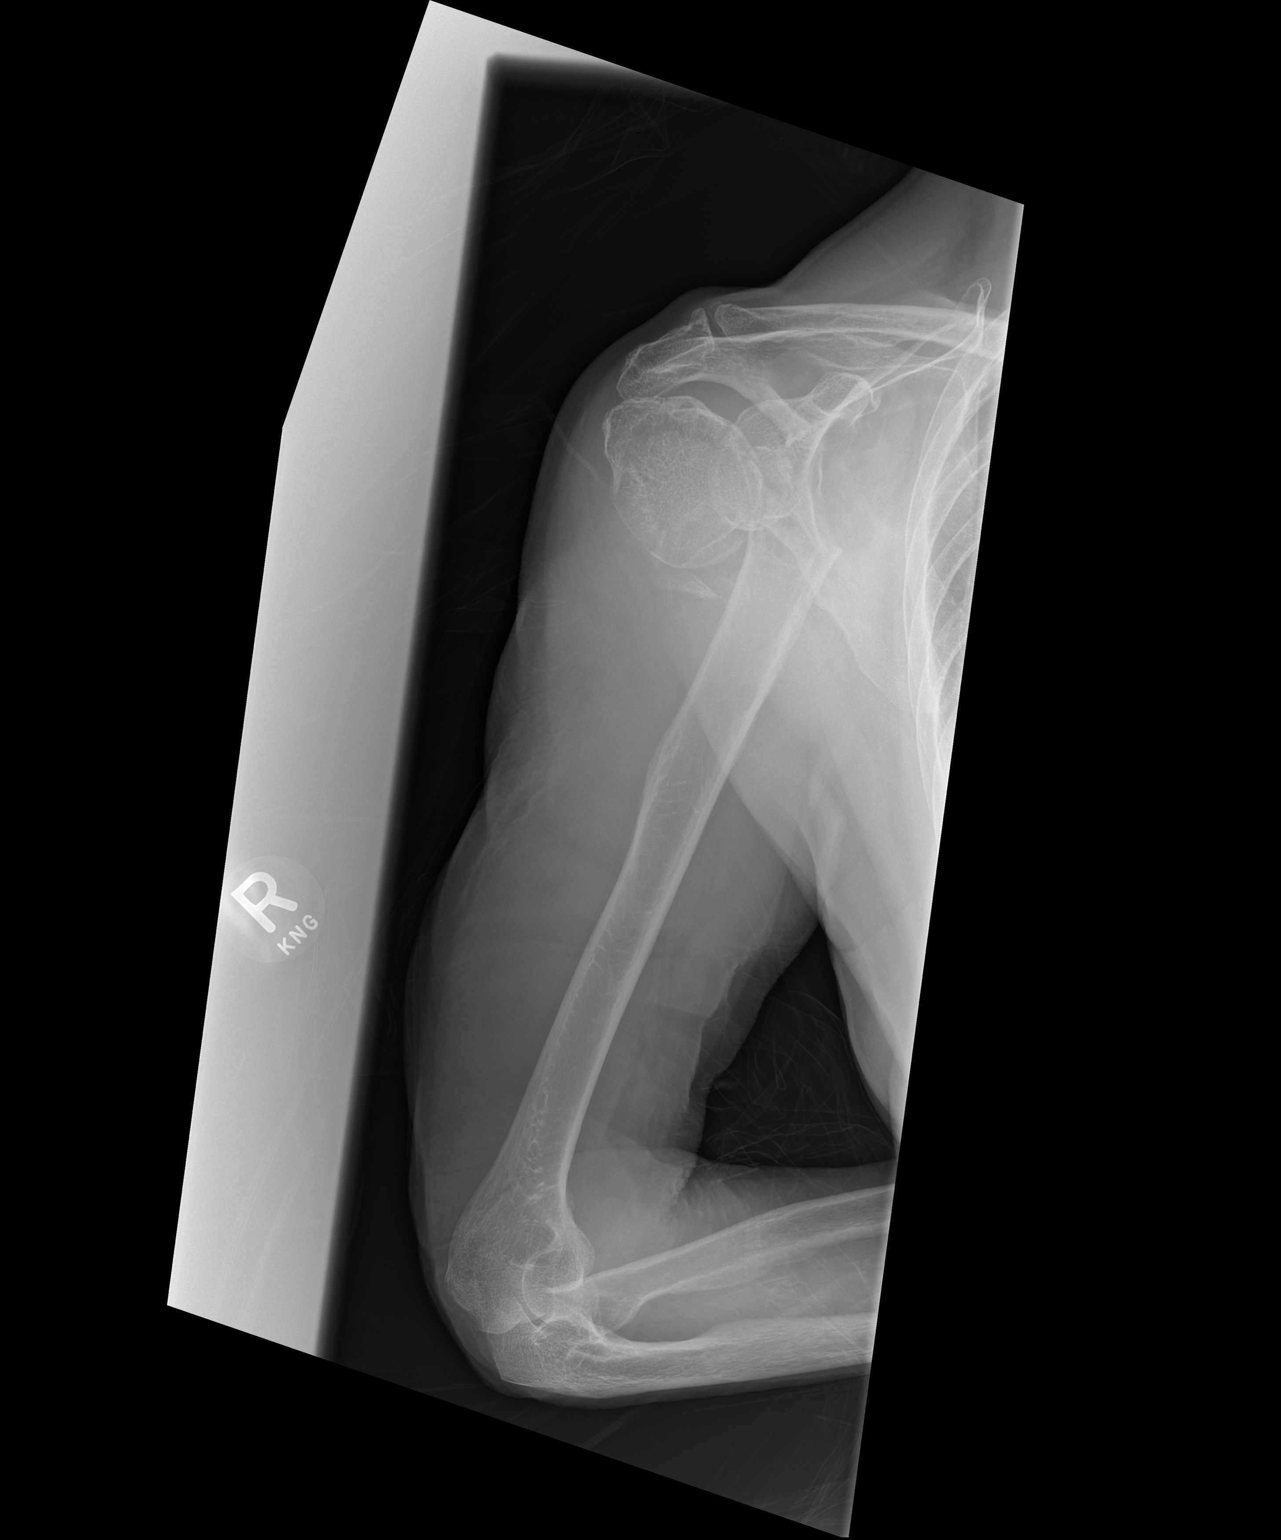

[x humerus ap right]
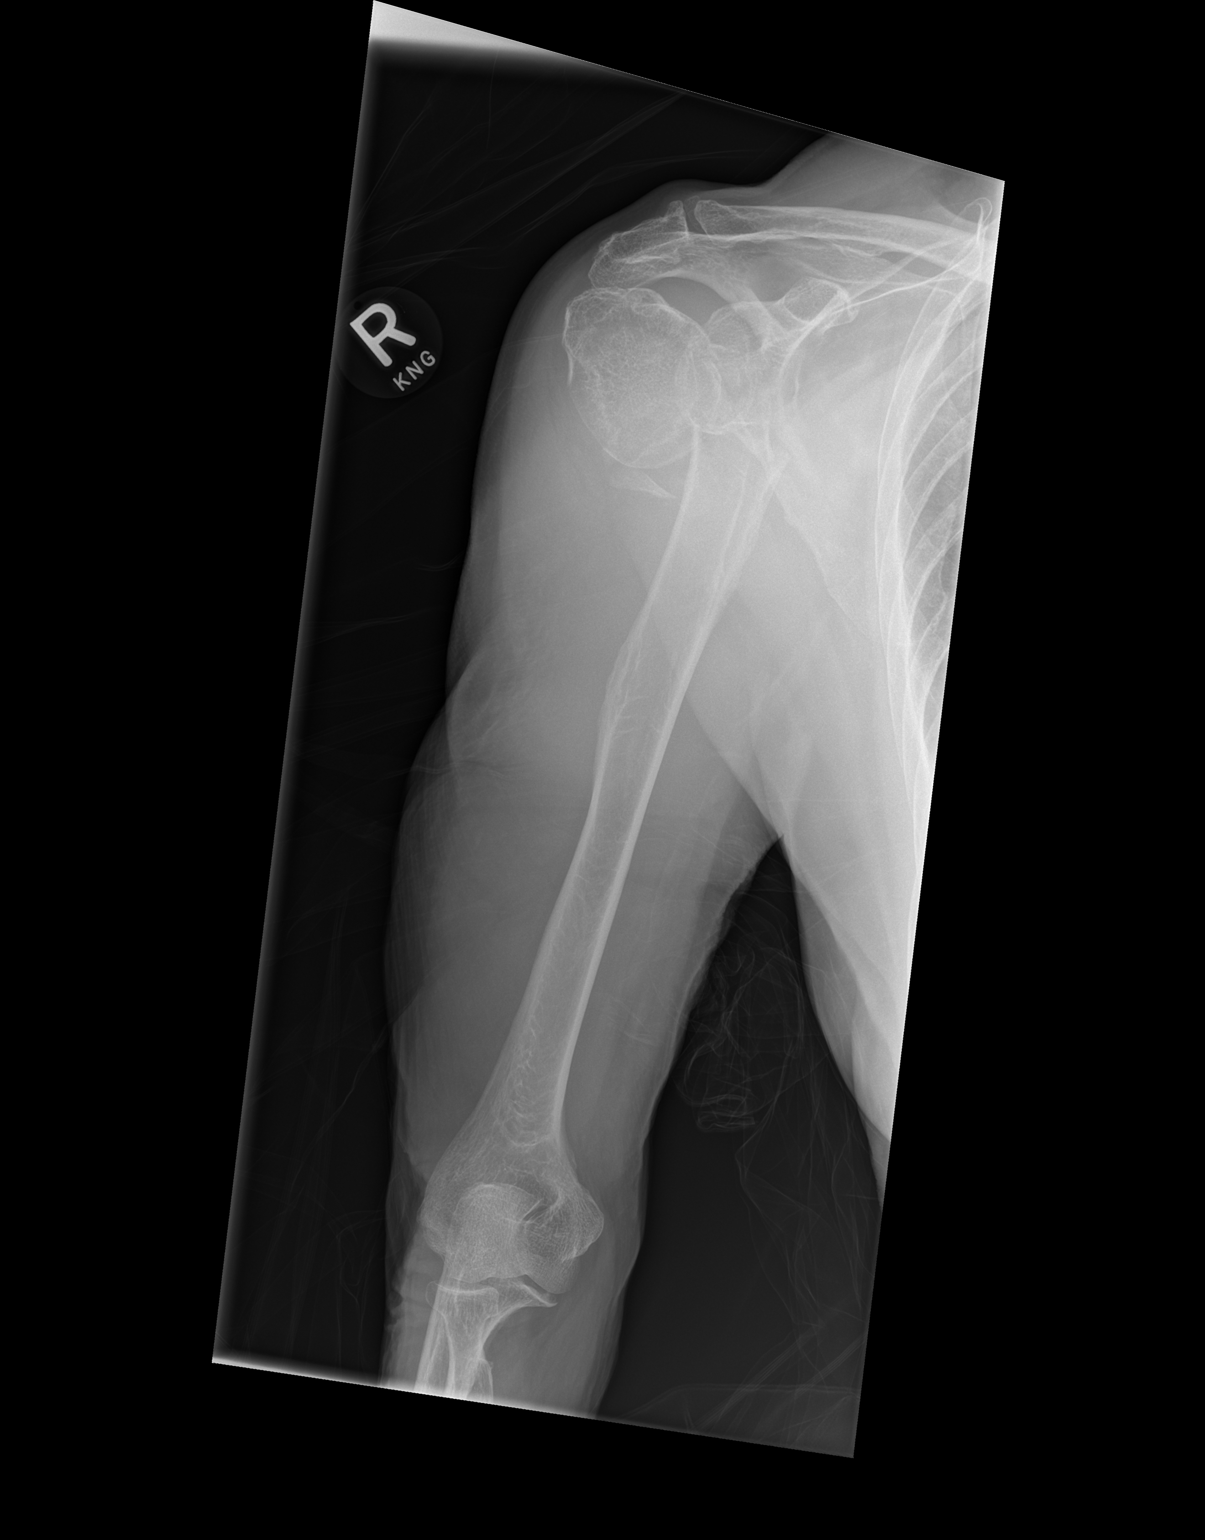

[2 of 2 positions shown; findings below may reference images not displayed]

FINDINGS: Comminuted fractures of the surgical neck of the right proximal
humerus with complete medial dislocation and mild overriding of the
distal fracture fragment. Lateral displacement of a butterfly
fragment. The humeral head appears to be rotated medially. No
definite glenohumeral dislocation. Degenerative changes in the
acromioclavicular joint with mild subacromial spurring. Soft tissue
swelling.

Midshaft and distal right humerus appears intact.
IMPRESSION: Comminuted and displaced fractures of the surgical neck of the right
humerus.

## 2018-07-31 IMAGING — CR DG HIP (WITH OR WITHOUT PELVIS) 2-3V*R*
3 series · 3 of 3 positions shown · non-contrast
Comparison: 06/04/2016

CLINICAL DATA: Patient presents with a right pelvic fracture after
mechanical fall today.

EXAM:
DG HIP (WITH OR WITHOUT PELVIS) 2-3V RIGHT

[x pelvis]
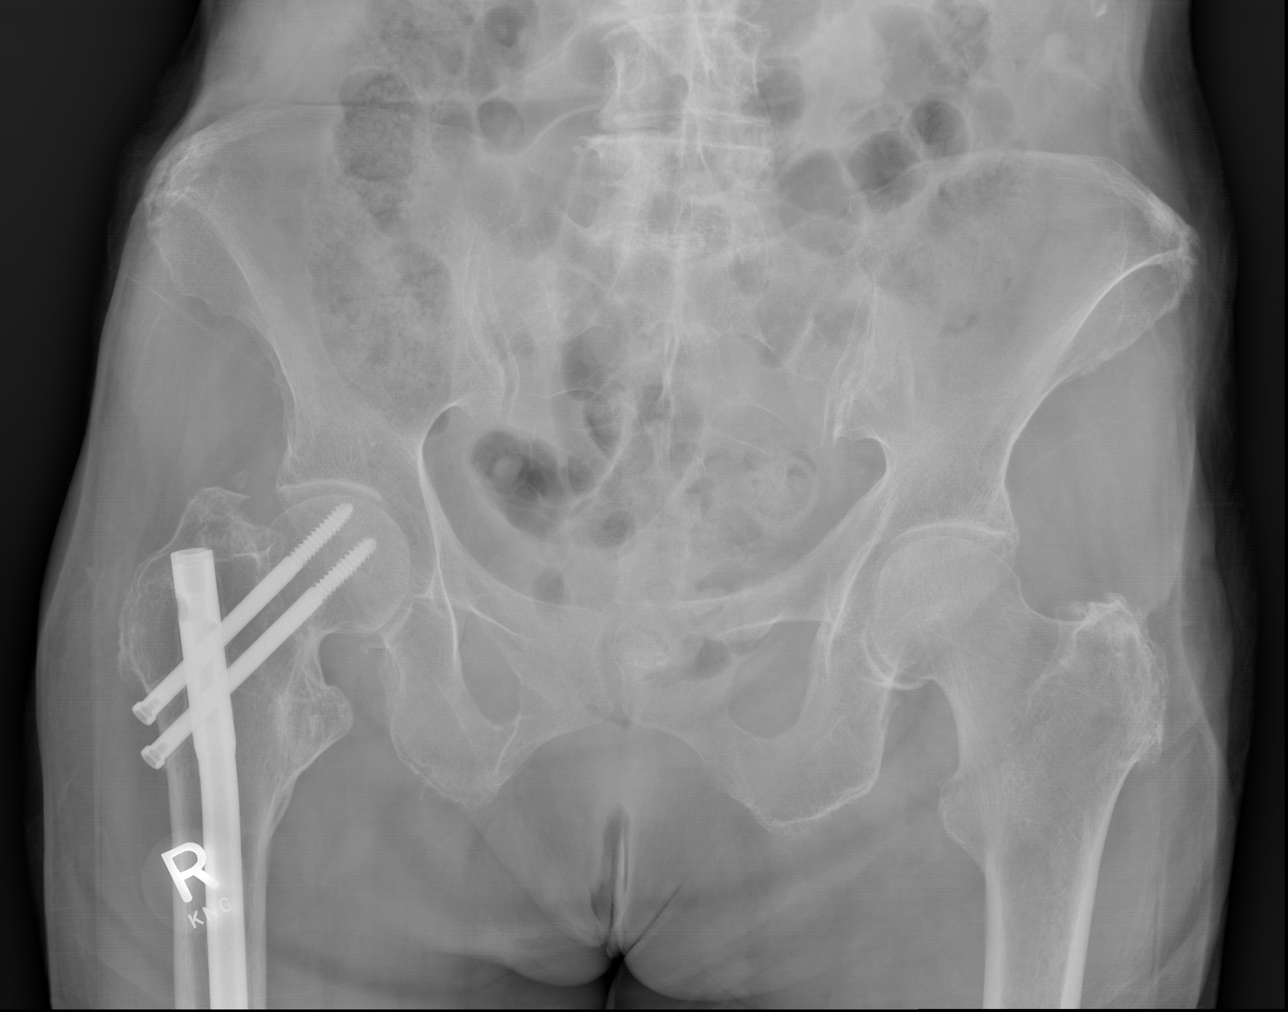

[x hip ap right (1 of 2)]
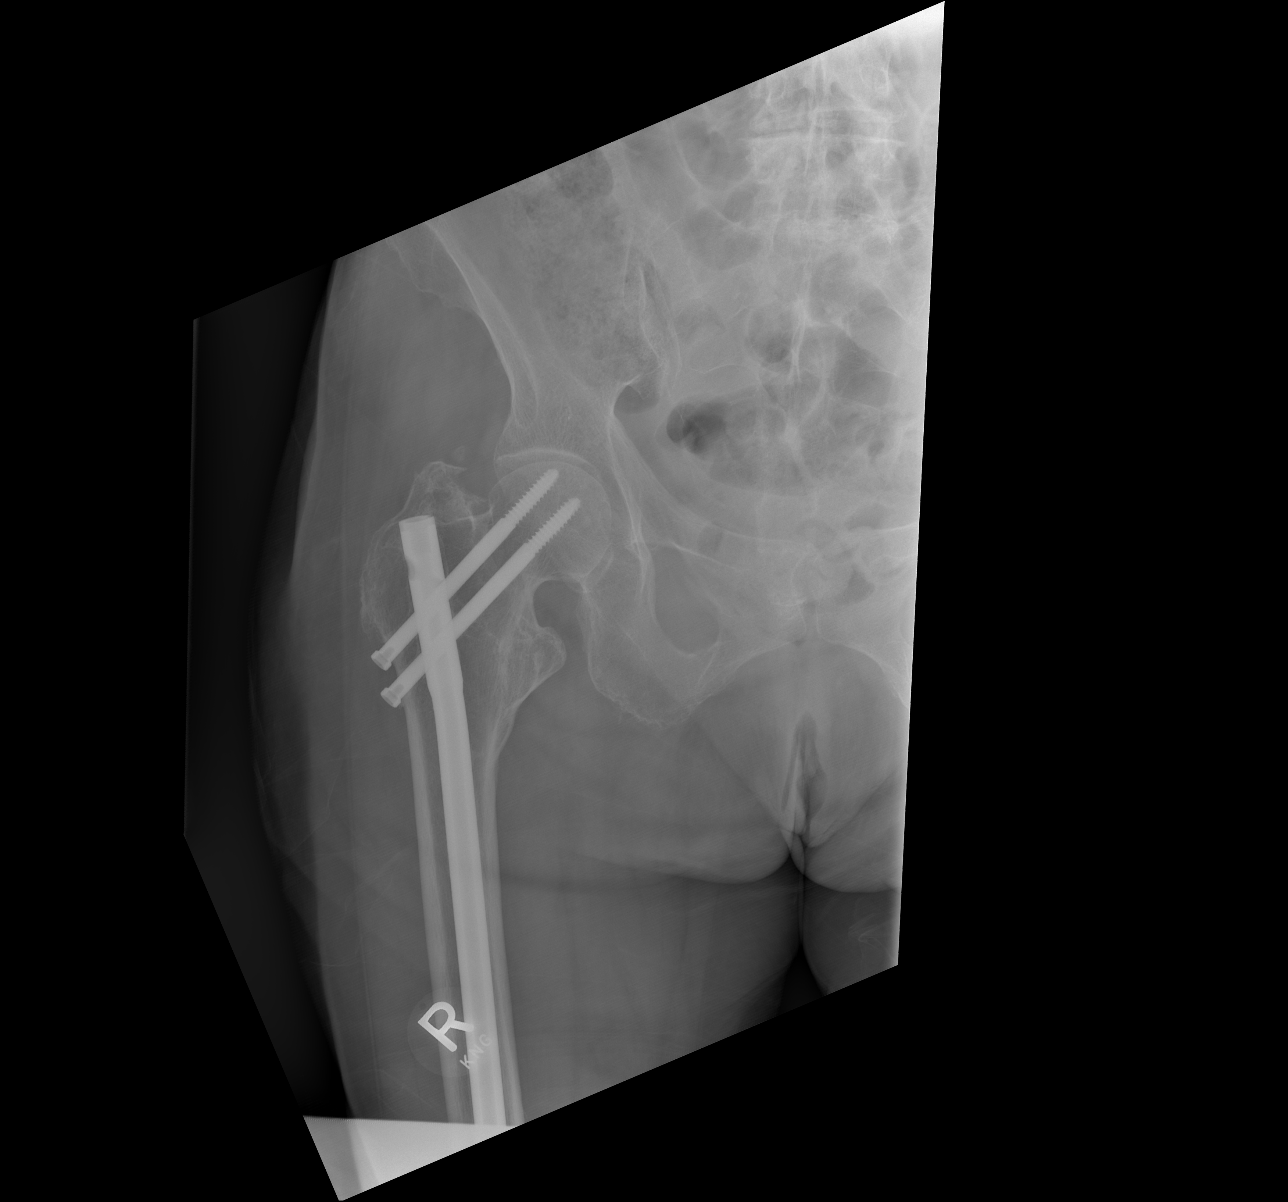

[x hip ap right (2 of 2)]
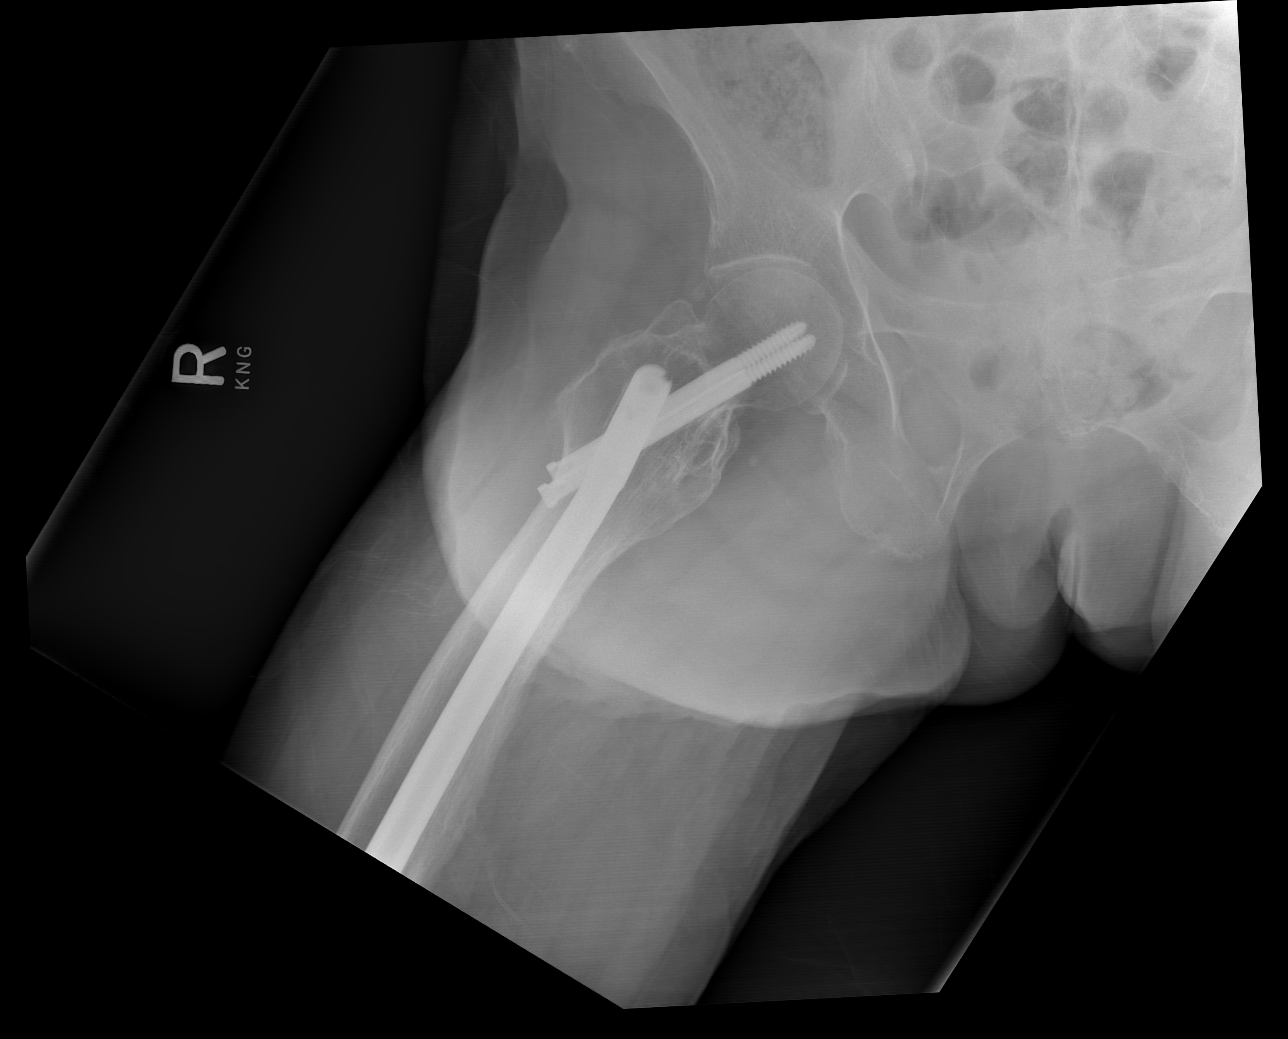

[3 of 3 positions shown; findings below may reference images not displayed]

FINDINGS: Postoperative injuries are very rod and screw fixation of the right
proximal femur and femoral neck. Distal portion of the
intramedullary rod is not included within the field of view. Old
healed fracture deformity of the trochanteric region. No dislocation
of the hip joint. Probable old fracture deformity of the right
inferior pubic ramus. No acute pelvic fractures are identified. SI
joints and symphysis pubis are not displaced. Degenerative changes
in the lower lumbar spine and hips.
IMPRESSION: Old internal fixation of a healed fracture of the right proximal
femoral inter trochanteric region. Probable old fracture of the
right inferior pubic ramus. No definite acute fractures.

## 2018-08-02 DIAGNOSIS — R262 Difficulty in walking, not elsewhere classified: Secondary | ICD-10-CM | POA: Diagnosis not present

## 2018-08-02 DIAGNOSIS — M25552 Pain in left hip: Secondary | ICD-10-CM | POA: Diagnosis not present

## 2018-08-02 DIAGNOSIS — M25551 Pain in right hip: Secondary | ICD-10-CM | POA: Diagnosis not present

## 2018-08-04 DIAGNOSIS — M25552 Pain in left hip: Secondary | ICD-10-CM | POA: Diagnosis not present

## 2018-08-04 DIAGNOSIS — M25551 Pain in right hip: Secondary | ICD-10-CM | POA: Diagnosis not present

## 2018-08-04 DIAGNOSIS — R262 Difficulty in walking, not elsewhere classified: Secondary | ICD-10-CM | POA: Diagnosis not present

## 2018-08-07 DIAGNOSIS — M25552 Pain in left hip: Secondary | ICD-10-CM | POA: Diagnosis not present

## 2018-08-07 DIAGNOSIS — R262 Difficulty in walking, not elsewhere classified: Secondary | ICD-10-CM | POA: Diagnosis not present

## 2018-08-07 DIAGNOSIS — M25551 Pain in right hip: Secondary | ICD-10-CM | POA: Diagnosis not present

## 2018-08-09 DIAGNOSIS — M25551 Pain in right hip: Secondary | ICD-10-CM | POA: Diagnosis not present

## 2018-08-09 DIAGNOSIS — R262 Difficulty in walking, not elsewhere classified: Secondary | ICD-10-CM | POA: Diagnosis not present

## 2018-08-09 DIAGNOSIS — M25552 Pain in left hip: Secondary | ICD-10-CM | POA: Diagnosis not present

## 2018-08-11 DIAGNOSIS — R262 Difficulty in walking, not elsewhere classified: Secondary | ICD-10-CM | POA: Diagnosis not present

## 2018-08-11 DIAGNOSIS — M25551 Pain in right hip: Secondary | ICD-10-CM | POA: Diagnosis not present

## 2018-08-11 DIAGNOSIS — M25552 Pain in left hip: Secondary | ICD-10-CM | POA: Diagnosis not present

## 2018-08-14 DIAGNOSIS — M25552 Pain in left hip: Secondary | ICD-10-CM | POA: Diagnosis not present

## 2018-08-14 DIAGNOSIS — M25551 Pain in right hip: Secondary | ICD-10-CM | POA: Diagnosis not present

## 2018-08-14 DIAGNOSIS — R262 Difficulty in walking, not elsewhere classified: Secondary | ICD-10-CM | POA: Diagnosis not present

## 2018-08-16 DIAGNOSIS — M25551 Pain in right hip: Secondary | ICD-10-CM | POA: Diagnosis not present

## 2018-08-16 DIAGNOSIS — R262 Difficulty in walking, not elsewhere classified: Secondary | ICD-10-CM | POA: Diagnosis not present

## 2018-08-16 DIAGNOSIS — M25552 Pain in left hip: Secondary | ICD-10-CM | POA: Diagnosis not present

## 2018-08-18 DIAGNOSIS — M25552 Pain in left hip: Secondary | ICD-10-CM | POA: Diagnosis not present

## 2018-08-18 DIAGNOSIS — M25551 Pain in right hip: Secondary | ICD-10-CM | POA: Diagnosis not present

## 2018-08-18 DIAGNOSIS — R262 Difficulty in walking, not elsewhere classified: Secondary | ICD-10-CM | POA: Diagnosis not present

## 2018-08-21 DIAGNOSIS — R262 Difficulty in walking, not elsewhere classified: Secondary | ICD-10-CM | POA: Diagnosis not present

## 2018-08-21 DIAGNOSIS — M25552 Pain in left hip: Secondary | ICD-10-CM | POA: Diagnosis not present

## 2018-08-21 DIAGNOSIS — M25551 Pain in right hip: Secondary | ICD-10-CM | POA: Diagnosis not present

## 2018-08-23 DIAGNOSIS — R262 Difficulty in walking, not elsewhere classified: Secondary | ICD-10-CM | POA: Diagnosis not present

## 2018-08-23 DIAGNOSIS — M25551 Pain in right hip: Secondary | ICD-10-CM | POA: Diagnosis not present

## 2018-08-23 DIAGNOSIS — M25552 Pain in left hip: Secondary | ICD-10-CM | POA: Diagnosis not present

## 2018-08-25 DIAGNOSIS — M25552 Pain in left hip: Secondary | ICD-10-CM | POA: Diagnosis not present

## 2018-08-25 DIAGNOSIS — M25551 Pain in right hip: Secondary | ICD-10-CM | POA: Diagnosis not present

## 2018-08-25 DIAGNOSIS — R262 Difficulty in walking, not elsewhere classified: Secondary | ICD-10-CM | POA: Diagnosis not present

## 2018-08-28 DIAGNOSIS — M25551 Pain in right hip: Secondary | ICD-10-CM | POA: Diagnosis not present

## 2018-08-28 DIAGNOSIS — M25552 Pain in left hip: Secondary | ICD-10-CM | POA: Diagnosis not present

## 2018-08-28 DIAGNOSIS — R262 Difficulty in walking, not elsewhere classified: Secondary | ICD-10-CM | POA: Diagnosis not present

## 2018-08-30 DIAGNOSIS — R262 Difficulty in walking, not elsewhere classified: Secondary | ICD-10-CM | POA: Diagnosis not present

## 2018-08-30 DIAGNOSIS — M25551 Pain in right hip: Secondary | ICD-10-CM | POA: Diagnosis not present

## 2018-08-30 DIAGNOSIS — H353221 Exudative age-related macular degeneration, left eye, with active choroidal neovascularization: Secondary | ICD-10-CM | POA: Diagnosis not present

## 2018-08-30 DIAGNOSIS — H35372 Puckering of macula, left eye: Secondary | ICD-10-CM | POA: Diagnosis not present

## 2018-08-30 DIAGNOSIS — M25552 Pain in left hip: Secondary | ICD-10-CM | POA: Diagnosis not present

## 2018-08-30 DIAGNOSIS — H35421 Microcystoid degeneration of retina, right eye: Secondary | ICD-10-CM | POA: Diagnosis not present

## 2018-08-30 DIAGNOSIS — H353213 Exudative age-related macular degeneration, right eye, with inactive scar: Secondary | ICD-10-CM | POA: Diagnosis not present

## 2018-09-01 DIAGNOSIS — M25551 Pain in right hip: Secondary | ICD-10-CM | POA: Diagnosis not present

## 2018-09-01 DIAGNOSIS — M25552 Pain in left hip: Secondary | ICD-10-CM | POA: Diagnosis not present

## 2018-09-01 DIAGNOSIS — R262 Difficulty in walking, not elsewhere classified: Secondary | ICD-10-CM | POA: Diagnosis not present

## 2018-09-04 DIAGNOSIS — M25551 Pain in right hip: Secondary | ICD-10-CM | POA: Diagnosis not present

## 2018-09-04 DIAGNOSIS — M25552 Pain in left hip: Secondary | ICD-10-CM | POA: Diagnosis not present

## 2018-09-04 DIAGNOSIS — R262 Difficulty in walking, not elsewhere classified: Secondary | ICD-10-CM | POA: Diagnosis not present

## 2018-09-06 DIAGNOSIS — M25552 Pain in left hip: Secondary | ICD-10-CM | POA: Diagnosis not present

## 2018-09-06 DIAGNOSIS — R262 Difficulty in walking, not elsewhere classified: Secondary | ICD-10-CM | POA: Diagnosis not present

## 2018-09-06 DIAGNOSIS — M25551 Pain in right hip: Secondary | ICD-10-CM | POA: Diagnosis not present

## 2018-09-08 DIAGNOSIS — M25551 Pain in right hip: Secondary | ICD-10-CM | POA: Diagnosis not present

## 2018-09-08 DIAGNOSIS — R262 Difficulty in walking, not elsewhere classified: Secondary | ICD-10-CM | POA: Diagnosis not present

## 2018-09-08 DIAGNOSIS — M25552 Pain in left hip: Secondary | ICD-10-CM | POA: Diagnosis not present

## 2018-10-12 ENCOUNTER — Other Ambulatory Visit: Payer: Self-pay | Admitting: Physician Assistant

## 2018-10-12 DIAGNOSIS — R1013 Epigastric pain: Secondary | ICD-10-CM

## 2018-10-19 MED ORDER — SUCRALFATE 1 G PO TABS: 1.0000 g | ORAL_TABLET | Freq: Every day | ORAL | 0 refills | Status: DC

## 2018-10-19 NOTE — Telephone Encounter (Signed)
Refill request for caratate 1 gm  #90 with 0 refills.  Pt last ov 07/26/18 with [facility].  No upcoming appts. Dgaddy, CMA

## 2018-11-07 DIAGNOSIS — H353221 Exudative age-related macular degeneration, left eye, with active choroidal neovascularization: Secondary | ICD-10-CM | POA: Diagnosis not present

## 2018-11-07 DIAGNOSIS — H35421 Microcystoid degeneration of retina, right eye: Secondary | ICD-10-CM | POA: Diagnosis not present

## 2018-11-07 DIAGNOSIS — H353213 Exudative age-related macular degeneration, right eye, with inactive scar: Secondary | ICD-10-CM | POA: Diagnosis not present

## 2018-11-07 DIAGNOSIS — H35373 Puckering of macula, bilateral: Secondary | ICD-10-CM | POA: Diagnosis not present

## 2018-12-29 ENCOUNTER — Ambulatory Visit: Payer: Self-pay | Admitting: Family Medicine

## 2019-01-25 ENCOUNTER — Encounter: Payer: Self-pay | Admitting: Physician Assistant

## 2019-01-29 ENCOUNTER — Other Ambulatory Visit: Payer: Self-pay | Admitting: *Deleted

## 2019-01-29 DIAGNOSIS — R1013 Epigastric pain: Secondary | ICD-10-CM

## 2019-01-29 MED ORDER — SUCRALFATE 1 G PO TABS: 1.0000 g | ORAL_TABLET | Freq: Every day | ORAL | 0 refills | Status: DC

## 2019-04-25 ENCOUNTER — Other Ambulatory Visit: Payer: Self-pay | Admitting: Family Medicine

## 2019-04-25 DIAGNOSIS — R1013 Epigastric pain: Secondary | ICD-10-CM

## 2019-04-26 ENCOUNTER — Encounter: Payer: Self-pay | Admitting: Physician Assistant

## 2019-04-26 ENCOUNTER — Other Ambulatory Visit: Payer: Self-pay | Admitting: Family Medicine

## 2019-04-26 ENCOUNTER — Other Ambulatory Visit: Payer: Self-pay | Admitting: *Deleted

## 2019-04-26 DIAGNOSIS — R1013 Epigastric pain: Secondary | ICD-10-CM

## 2019-04-26 MED ORDER — SUCRALFATE 1 G PO TABS: 1.0000 g | ORAL_TABLET | Freq: Every day | ORAL | 0 refills | Status: DC

## 2019-07-23 ENCOUNTER — Other Ambulatory Visit: Payer: Self-pay | Admitting: Family Medicine

## 2019-07-23 DIAGNOSIS — R1013 Epigastric pain: Secondary | ICD-10-CM

## 2019-07-23 NOTE — Telephone Encounter (Signed)
Requested medication (s) are due for refill today: yes  Requested medication (s) are on the active medication list: yes  Last refill: 04/26/2019  Future visit scheduled: no  Notes to clinic:  Review for refill    Requested Prescriptions  Pending Prescriptions Disp Refills   sucralfate (CARAFATE) 1 g tablet [Pharmacy Med Name: SUCRALFATE 1GM TABLETS] 90 tablet 0    Sig: TAKE 1 TABLET(1 GRAM) BY MOUTH DAILY WITH BREAKFAST     Gastroenterology: Antiacids Failed - 07/23/2019  3:47 AM      Failed - Valid encounter within last 12 months    Recent Outpatient Visits          12 months ago Leg pain, bilateral   [facility] [place], [place] D, PA-C   1 year ago Bigeminy   [facility] [place], [place] D, PA-C   1 year ago Chest heaviness   [facility] [place], [place] D, PA-C   1 year ago Fatigue, unspecified type   [facility] [place], [place] D, PA-C   1 year ago Blood pressure check   [facility] at [facility], [place] D, [place]

## 2019-08-08 ENCOUNTER — Other Ambulatory Visit: Payer: Self-pay

## 2019-08-08 ENCOUNTER — Telehealth (INDEPENDENT_AMBULATORY_CARE_PROVIDER_SITE_OTHER): Payer: Medicare Other | Admitting: Family Medicine

## 2019-08-08 DIAGNOSIS — Z7189 Other specified counseling: Secondary | ICD-10-CM | POA: Diagnosis not present

## 2019-08-08 DIAGNOSIS — R1013 Epigastric pain: Secondary | ICD-10-CM | POA: Diagnosis not present

## 2019-08-08 MED ORDER — SUCRALFATE 1 G PO TABS: ORAL_TABLET | ORAL | 0 refills | Status: DC

## 2019-08-08 NOTE — Progress Notes (Signed)
Telemedicine Encounter- SOAP NOTE Established Patient  This telephone encounter was conducted with the patient's (or proxy's) verbal consent via audio telecommunications: yes/no: Yes Patient was instructed to have this encounter in a suitably private space; and to only have persons present to whom they give permission to participate. In addition, patient identity was confirmed by use of name plus two identifiers (DOB and address).  I discussed the limitations, risks, security and privacy concerns of performing an evaluation and management service by telephone and the availability of in person appointments. I also discussed with the patient that there may be a patient responsible charge related to this service. The patient expressed understanding and agreed to proceed.  I spent a total of TIME; 0 MIN TO 60 MIN: 15 minutes talking with the patient or their proxy.  No chief complaint on file.   Subjective   Rebecca Munoz is a 85 y.o. established patient. Telephone visit today for  HPI  Rebecca Munoz her son and POA    Patient Active Problem List   Diagnosis Date Noted  . Bigeminy 05/14/2018  . Near syncope 05/14/2018    Past Medical History:  Diagnosis Date  . Cancer [facility])    breast  . Osteoporosis     Current Outpatient Medications  Medication Sig Dispense Refill  . acetaminophen (TYLENOL) 500 MG tablet Take 1 tablet (500 mg total) by mouth every 6 (six) hours as needed. 30 tablet 0  . aspirin 81 MG tablet Take 81 mg by mouth daily.    Marland Kitchen CALCIUM PO Take 1 tablet by mouth daily.    Marland Kitchen loratadine (CLARITIN) 10 MG tablet TAKE 1 TABLET BY MOUTH EVERY DAY 90 tablet 0  . Melatonin-Pyridoxine (MELATIN PO) Take by mouth at bedtime as needed.    . metoprolol tartrate (LOPRESSOR) 25 MG tablet TK 1/2 T PO BID  1  . Multiple Vitamins-Minerals (MULTIVITAMIN WITH MINERALS) tablet Take 1 tablet by mouth daily.    . mupirocin ointment (BACTROBAN) 2 % Apply 1 application topically 3  (three) times daily. 22 g 0  . omeprazole (PRILOSEC) 20 MG capsule Take 1 capsule (20 mg total) by mouth daily as needed (reflux). (Patient not taking: Reported on 07/26/2018)    . sucralfate (CARAFATE) 1 g tablet TAKE 1 TABLET(1 GRAM) BY MOUTH DAILY WITH BREAKFAST 90 tablet 0  . triamcinolone (NASACORT) 55 MCG/ACT AERO nasal inhaler Place 1 spray into the nose 2 (two) times daily. 1 Inhaler 0  . TURMERIC PO Take by mouth daily.     No current facility-administered medications for this visit.     No Known Allergies  Social History   Socioeconomic History  . Marital status: Widowed    Spouse name: Not on file  . Number of children: Not on file  . Years of education: Not on file  . Highest education level: Not on file  Occupational History  . Not on file  Social Needs  . Financial resource strain: Not on file  . Food insecurity    Worry: Not on file    Inability: Not on file  . Transportation needs    Medical: Not on file    Non-medical: Not on file  Tobacco Use  . Smoking status: Never Smoker  . Smokeless tobacco: Never Used  Substance and Sexual Activity  . Alcohol use: No    Alcohol/week: 0.0 standard drinks  . Drug use: No  . Sexual activity: Not Currently  Lifestyle  . Physical activity  Days per week: Not on file    Minutes per session: Not on file  . Stress: Not on file  Relationships  . Social Herbalist on phone: Not on file    Gets together: Not on file    Attends religious service: Not on file    Active member of club or organization: Not on file    Attends meetings of clubs or organizations: Not on file    Relationship status: Not on file  . Intimate partner violence    Fear of current or ex partner: Not on file    Emotionally abused: Not on file    Physically abused: Not on file    Forced sexual activity: Not on file  Other Topics Concern  . Not on file  Social History Narrative  . Not on file    ROS  Objective   Vitals as  reported by the patient: There were no vitals filed for this visit.  Diagnoses and all orders for this visit:  Advice given about COVID-19 virus by telephone -  Discussed ways to wear your mask and social distancing  Abdominal pain, epigastric- continue your current medication -     sucralfate (CARAFATE) 1 g tablet; TAKE 1 TABLET(1 GRAM) BY MOUTH DAILY WITH BREAKFAST     I discussed the assessment and treatment plan with the patient. The patient was provided an opportunity to ask questions and all were answered. The patient agreed with the plan and demonstrated an understanding of the instructions.   The patient was advised to call back or seek an in-person evaluation if the symptoms worsen or if the condition fails to improve as anticipated.  I provided 15 minutes of non-face-to-face time during this encounter.  Forrest Moron, MD  [facility]

## 2019-08-08 NOTE — Progress Notes (Signed)
Needs refill on pended medication, pt is having no other medical concerns. Just dealing with the anxiety of covid. Medication and pharmacy verified.

## 2019-09-25 ENCOUNTER — Encounter: Payer: Self-pay | Admitting: Registered Nurse

## 2019-09-25 ENCOUNTER — Ambulatory Visit (INDEPENDENT_AMBULATORY_CARE_PROVIDER_SITE_OTHER): Payer: Medicare Other | Admitting: Registered Nurse

## 2019-09-25 ENCOUNTER — Other Ambulatory Visit: Payer: Self-pay

## 2019-09-25 VITALS — BP 168/85 | HR 60 | Temp 98.7°F | Resp 12 | Ht 66.0 in | Wt 126.0 lb

## 2019-09-25 DIAGNOSIS — F05 Delirium due to known physiological condition: Secondary | ICD-10-CM | POA: Diagnosis not present

## 2019-09-25 DIAGNOSIS — R410 Disorientation, unspecified: Secondary | ICD-10-CM | POA: Insufficient documentation

## 2019-09-25 DIAGNOSIS — R319 Hematuria, unspecified: Secondary | ICD-10-CM

## 2019-09-25 DIAGNOSIS — Z8639 Personal history of other endocrine, nutritional and metabolic disease: Secondary | ICD-10-CM

## 2019-09-25 LAB — POCT CBC
Granulocyte percent: 58.4 %G (ref 37–80)
HCT, POC: 40.6 % (ref 29–41)
Hemoglobin: 13.7 g/dL (ref 11–14.6)
Lymph, poc: 1.7 (ref 0.6–3.4)
MCH, POC: 30.9 pg (ref 27–31.2)
MCHC: 33.8 g/dL (ref 31.8–35.4)
MCV: 91.5 fL (ref 76–111)
MID (cbc): 0.4 (ref 0–0.9)
MPV: 7.4 fL (ref 0–99.8)
POC Granulocyte: 3 (ref 2–6.9)
POC LYMPH PERCENT: 32.8 %L (ref 10–50)
POC MID %: 8.8 %M (ref 0–12)
Platelet Count, POC: 208 10*3/uL (ref 142–424)
RBC: 4.43 M/uL (ref 4.04–5.48)
RDW, POC: 14.1 %
WBC: 5.1 10*3/uL (ref 4.6–10.2)

## 2019-09-25 LAB — POCT GLYCOSYLATED HEMOGLOBIN (HGB A1C): Hemoglobin A1C: 5.8 % — AB (ref 4.0–5.6)

## 2019-09-25 LAB — POCT URINALYSIS DIP (CLINITEK)
Bilirubin, UA: NEGATIVE
Blood, UA: NEGATIVE
Glucose, UA: NEGATIVE mg/dL
Ketones, POC UA: NEGATIVE mg/dL
Leukocytes, UA: NEGATIVE
Nitrite, UA: NEGATIVE
POC PROTEIN,UA: 30 — AB
Spec Grav, UA: >=1.03 — AB (ref 1.010–1.025)
Urobilinogen, UA: 0.2 E.U./dL
pH, UA: 6 (ref 5.0–8.0)

## 2019-09-25 NOTE — Progress Notes (Signed)
Established Patient Office Visit  Subjective:  Patient ID: Rebecca Munoz, female    DOB: [birth year]  Age: 85 y.o. MRN: [id number]  CC:  Chief Complaint  Patient presents with  . memory problem    Pt stated ---confused, difficulty with memory, sleep patteern, dizzy, balance problem, urinating frequency--2 days    HPI Rebecca Munoz presents for confusion  Onset yesterday. Notes that she was playing a game of Phase 10, a game she plays nearly daily, when she was unable to forget the procession of the game. She notes that this was startling and upsetting to her. She has noticed that her balance has shifted, and she's had some difficulties with dizziness. Additionally, reports possible urinary frequency, but is somewhat inconsistent in this report.  She lives at a long term care facility but she is still largely independent. She manages most of her own medications. She denies falls recently - last fall was more than 6 months ago and she did not hit her head.  No respiratory or CV symptoms to report  BP somewhat elevated on arrival.    Past Medical History:  Diagnosis Date  . Cancer [facility])    breast  . Osteoporosis     Past Surgical History:  Procedure Laterality Date  . APPENDECTOMY    . BACK SURGERY    . BREAST SURGERY Right    lumpectomy   . EYE SURGERY    . JOINT REPLACEMENT      No family history on file.  Social History   Socioeconomic History  . Marital status: Widowed    Spouse name: Not on file  . Number of children: Not on file  . Years of education: Not on file  . Highest education level: Not on file  Occupational History  . Not on file  Tobacco Use  . Smoking status: Never Smoker  . Smokeless tobacco: Never Used  Substance and Sexual Activity  . Alcohol use: No    Alcohol/week: 0.0 standard drinks  . Drug use: No  . Sexual activity: Not Currently  Other Topics Concern  . Not on file  Social History Narrative  . Not on file   Social Determinants  of Health   Financial Resource Strain:   . Difficulty of Paying Living Expenses: Not on file  Food Insecurity:   . Worried About Charity fundraiser in the Last Year: Not on file  . Ran Out of Food in the Last Year: Not on file  Transportation Needs:   . Lack of Transportation (Medical): Not on file  . Lack of Transportation (Non-Medical): Not on file  Physical Activity:   . Days of Exercise per Week: Not on file  . Minutes of Exercise per Session: Not on file  Stress:   . Feeling of Stress : Not on file  Social Connections:   . Frequency of Communication with Friends and Family: Not on file  . Frequency of Social Gatherings with Friends and Family: Not on file  . Attends Religious Services: Not on file  . Active Member of Clubs or Organizations: Not on file  . Attends Archivist Meetings: Not on file  . Marital Status: Not on file  Intimate Partner Violence:   . Fear of Current or Ex-Partner: Not on file  . Emotionally Abused: Not on file  . Physically Abused: Not on file  . Sexually Abused: Not on file    Outpatient Medications Prior to Visit  Medication Sig Dispense Refill  .  aspirin 81 MG tablet Take 81 mg by mouth daily.    Marland Kitchen CALCIUM PO Take 1 tablet by mouth daily.    Marland Kitchen loratadine (CLARITIN) 10 MG tablet TAKE 1 TABLET BY MOUTH EVERY DAY 90 tablet 0  . Melatonin-Pyridoxine (MELATIN PO) Take by mouth at bedtime as needed.    . Multiple Vitamins-Minerals (MULTIVITAMIN WITH MINERALS) tablet Take 1 tablet by mouth daily.    . sucralfate (CARAFATE) 1 g tablet TAKE 1 TABLET(1 GRAM) BY MOUTH DAILY WITH BREAKFAST 90 tablet 0  . TURMERIC PO Take by mouth daily.    Marland Kitchen triamcinolone (NASACORT) 55 MCG/ACT AERO nasal inhaler Place 1 spray into the nose 2 (two) times daily. 1 Inhaler 0  . acetaminophen (TYLENOL) 500 MG tablet Take 1 tablet (500 mg total) by mouth every 6 (six) hours as needed. 30 tablet 0  . metoprolol tartrate (LOPRESSOR) 25 MG tablet TK 1/2 T PO BID  1  .  mupirocin ointment (BACTROBAN) 2 % Apply 1 application topically 3 (three) times daily. 22 g 0  . omeprazole (PRILOSEC) 20 MG capsule Take 1 capsule (20 mg total) by mouth daily as needed (reflux). (Patient not taking: Reported on 07/26/2018)     No facility-administered medications prior to visit.    No Known Allergies  ROS Review of Systems  Constitutional: Negative.   HENT: Negative.   Eyes: Negative.   Respiratory: Negative.   Cardiovascular: Negative.   Gastrointestinal: Negative.   Endocrine: Negative.   Genitourinary: Negative.   Musculoskeletal: Negative.   Skin: Negative.   Allergic/Immunologic: Negative.   Neurological: Negative.   Hematological: Negative.   Psychiatric/Behavioral: Positive for confusion, decreased concentration and sleep disturbance. Negative for agitation, behavioral problems, dysphoric mood, hallucinations, self-injury and suicidal ideas. The patient is not nervous/anxious and is not hyperactive.   All other systems reviewed and are negative.     Objective:    Physical Exam  Constitutional: She is oriented to person, place, and time. She appears well-developed and well-nourished. No distress.  Cardiovascular: Normal rate, regular rhythm, normal heart sounds and intact distal pulses. Exam reveals no gallop and no friction rub.  No murmur heard. Pulmonary/Chest: Effort normal and breath sounds normal. No respiratory distress. She has no wheezes. She has no rales. She exhibits no tenderness.  Neurological: She is alert and oriented to person, place, and time. No cranial nerve deficit or sensory deficit. She exhibits normal muscle tone. Coordination and gait normal. GCS eye subscore is 4. GCS verbal subscore is 5. GCS motor subscore is 6.  Skin: Skin is warm and dry. No rash noted. She is not diaphoretic. No erythema. No pallor.  Psychiatric: She has a normal mood and affect. Her behavior is normal. Judgment and thought content normal.  Nursing note and  vitals reviewed.   BP (!) 168/85   Pulse 60   Temp 98.7 F (37.1 C)   Resp 12   Ht 5' 6"  (1.676 m)   Wt 126 lb (57.2 kg)   SpO2 96%   BMI 20.34 kg/m  Wt Readings from Last 3 Encounters:  09/25/19 126 lb (57.2 kg)  07/26/18 134 lb (60.8 kg)  05/26/18 131 lb 6.4 oz (59.6 kg)     Health Maintenance Due  Topic Date Due  . TETANUS/TDAP  02/27/1942  . DEXA SCAN  02/28/1988  . PNA vac Low Risk Adult (1 of 2 - PCV13) 02/28/1988    There are no preventive care reminders to display for this patient.  Lab Results  Component Value Date   TSH 2.230 05/17/2018   Lab Results  Component Value Date   WBC 5.1 09/25/2019   HGB 13.7 09/25/2019   HCT 40.6 09/25/2019   MCV 91.5 09/25/2019   PLT 187 05/14/2018   Lab Results  Component Value Date   NA 141 05/17/2018   K 4.6 05/17/2018   CO2 24 05/17/2018   GLUCOSE 81 05/17/2018   BUN 15 05/17/2018   CREATININE 0.74 05/17/2018   BILITOT 0.5 05/14/2018   ALKPHOS 114 05/14/2018   AST 23 05/14/2018   ALT 23 05/14/2018   PROT 6.8 05/14/2018   ALBUMIN 3.9 05/14/2018   CALCIUM 9.7 05/17/2018   ANIONGAP 7 05/15/2018   Lab Results  Component Value Date   CHOL 260 (H) 03/02/2017   Lab Results  Component Value Date   HDL 62 03/02/2017   Lab Results  Component Value Date   LDLCALC 180 (H) 03/02/2017   Lab Results  Component Value Date   TRIG 90 03/02/2017   Lab Results  Component Value Date   CHOLHDL 4.2 03/02/2017   Lab Results  Component Value Date   HGBA1C 5.8 (A) 09/25/2019      Assessment & Plan:   Problem List Items Addressed This Visit    None    Visit Diagnoses    Acute delirium    -  Primary   Relevant Orders   Ambulatory referral to Neurology   Hematuria, unspecified type       Relevant Orders   POCT URINALYSIS DIP (CLINITEK) (Completed)   Acute confusional state       Relevant Orders   POCT CBC (Completed)   CMP14+EGFR   TSH   Ambulatory referral to Neurology   History of elevated glucose        Relevant Orders   POCT glycosylated hemoglobin (Hb A1C) (Completed)      No orders of the defined types were placed in this encounter.   Follow-up: No follow-ups on file.   PLAN  Cbc, A1c, and UA dip onsite do not reveal cause for acute delirium.  Will send out CMP and TSH.   Will send stat referral to neuro - concern for TIA or other neuro abnormality. No clear etiology today, difficult to assess pt mental status since I do not have a baseline reference. This acute of a change in a patient as healthy as Ms. Farone is concerning.  Patient encouraged to call clinic with any questions, comments, or concerns.   Maximiano Coss, NP

## 2019-09-26 ENCOUNTER — Encounter: Payer: Self-pay | Admitting: Registered Nurse

## 2019-09-26 LAB — CMP14+EGFR
ALT: 15 IU/L (ref 0–32)
AST: 21 IU/L (ref 0–40)
Albumin/Globulin Ratio: 1.8 (ref 1.2–2.2)
Albumin: 4.5 g/dL (ref 3.5–4.6)
Alkaline Phosphatase: 99 IU/L (ref 39–117)
BUN/Creatinine Ratio: 20 (ref 12–28)
BUN: 16 mg/dL (ref 10–36)
Bilirubin Total: 0.5 mg/dL (ref 0.0–1.2)
CO2: 24 mmol/L (ref 20–29)
Calcium: 10.3 mg/dL (ref 8.7–10.3)
Chloride: 104 mmol/L (ref 96–106)
Creatinine, Ser: 0.82 mg/dL (ref 0.57–1.00)
GFR calc Af Amer: 70 mL/min/{1.73_m2} (ref >59)
GFR calc non Af Amer: 61 mL/min/{1.73_m2} (ref >59)
Globulin, Total: 2.5 g/dL (ref 1.5–4.5)
Glucose: 93 mg/dL (ref 65–99)
Potassium: 4.8 mmol/L (ref 3.5–5.2)
Sodium: 142 mmol/L (ref 134–144)
Total Protein: 7 g/dL (ref 6.0–8.5)

## 2019-09-26 LAB — TSH: TSH: 1.9 u[IU]/mL (ref 0.450–4.500)

## 2019-09-27 ENCOUNTER — Encounter: Payer: Self-pay | Admitting: Neurology

## 2019-09-27 ENCOUNTER — Other Ambulatory Visit: Payer: Self-pay

## 2019-09-27 ENCOUNTER — Ambulatory Visit (INDEPENDENT_AMBULATORY_CARE_PROVIDER_SITE_OTHER): Payer: Medicare Other | Admitting: Neurology

## 2019-09-27 VITALS — BP 119/65 | HR 77 | Temp 97.9°F | Ht 66.0 in | Wt 126.0 lb

## 2019-09-27 DIAGNOSIS — R4789 Other speech disturbances: Secondary | ICD-10-CM

## 2019-09-27 DIAGNOSIS — R413 Other amnesia: Secondary | ICD-10-CM | POA: Diagnosis not present

## 2019-09-27 NOTE — Progress Notes (Signed)
PATIENT: Rebecca Munoz DOB: Aug 22, 1921  Chief Complaint  Patient presents with  . Concern for TIA event    She is here with her son, Rebecca Munoz.  On 09/24/2019, she had an event of forgetfulness, confusion, feeling off balance and dizziness.  She had some confusion the following day as well but then returned to her baseline.   Marland Kitchen PCP    Rebecca Moron, MD     HISTORICAL  Rebecca Munoz is a 85 year old female, seen in request by primary care physician Dr. Nolon Munoz, Rebecca Munoz for evaluation of word finding difficulties, and confusion, she is accompanied by her son Rebecca Munoz at today's visit on September 27, 2019.  I have reviewed and summarized the referring note from the referring physician.  She had past medical history of osteoporosis, breast cancer, taking aspirin 81 mg daily, lives in independent living, she traveled and lived with her husband all over the [place] before her husband passed away in 2011/01/10.  She was trained, and often speak in public for [employer].  She was noted to have intermittent word finding difficulties since January 09, 2014, getting worse since 01/10/2019, because Covid 19 isolations, she was noted to have intermittent confusion, word finding difficulties, she tends to repeat herself, self-conscious speaking in front of the people, she has mild right-sided low back pain, radiating pain to right hip, ambulate with a walker  Mini-Mental status today is 26  Laboratory evaluations in December 2020 A1c was 5.8, REVIEW OF SYSTEMS: Full 14 system review of systems performed and notable only for as above All other review of systems were negative.  ALLERGIES: No Known Allergies  HOME MEDICATIONS: Current Outpatient Medications  Medication Sig Dispense Refill  . aspirin 81 MG tablet Take 81 mg by mouth daily.    Marland Kitchen CALCIUM PO Take 1 tablet by mouth daily.    . diphenhydrAMINE HCl (BENADRYL PO) Take 1 tablet by mouth at bedtime as needed.    Rebecca Munoz  Calcium (STOOL SOFTENER PO) Take 1 tablet by mouth daily as needed.    . loratadine (CLARITIN) 10 MG tablet TAKE 1 TABLET BY MOUTH EVERY DAY 90 tablet 0  . Melatonin-Pyridoxine (MELATIN PO) Take by mouth at bedtime as needed.    . Multiple Vitamins-Minerals (MULTIVITAMIN WITH MINERALS) tablet Take 1 tablet by mouth daily.    . sucralfate (CARAFATE) 1 g tablet TAKE 1 TABLET(1 GRAM) BY MOUTH DAILY WITH BREAKFAST 90 tablet 0  . TURMERIC PO Take by mouth daily.     No current facility-administered medications for this visit.    PAST MEDICAL HISTORY: Past Medical History:  Diagnosis Date  . Cancer (HCC)    breast  . Osteoporosis   . Seasonal allergies     PAST SURGICAL HISTORY: Past Surgical History:  Procedure Laterality Date  . APPENDECTOMY    . BACK SURGERY    . BREAST SURGERY Right    lumpectomy   . EYE SURGERY    . JOINT REPLACEMENT      FAMILY HISTORY: Family History  Problem Relation Age of Onset  . Heart disease Mother   . Colon cancer Father     SOCIAL HISTORY: Social History   Socioeconomic History  . Marital status: Widowed    Spouse name: Not on file  . Number of children: Not on file  . Years of education: three semesters of college  . Highest education level: Not on file  Occupational History  . Occupation: Retired  Tobacco Use  .  Smoking status: Never Smoker  . Smokeless tobacco: Never Used  Substance and Sexual Activity  . Alcohol use: No    Alcohol/week: 0.0 standard drinks  . Drug use: No  . Sexual activity: Not Currently  Other Topics Concern  . Not on file  Social History Narrative   She resides in an independent living facility.  She has staff that helps her.   Right-handed.   Caffeine use:  No daily use.   Two adopted children.   Social Determinants of Health   Financial Resource Strain:   . Difficulty of Paying Living Expenses: Not on file  Food Insecurity:   . Worried About Charity fundraiser in the Last Year: Not on file  .  Ran Out of Food in the Last Year: Not on file  Transportation Needs:   . Lack of Transportation (Medical): Not on file  . Lack of Transportation (Non-Medical): Not on file  Physical Activity:   . Days of Exercise per Week: Not on file  . Minutes of Exercise per Session: Not on file  Stress:   . Feeling of Stress : Not on file  Social Connections:   . Frequency of Communication with Friends and Family: Not on file  . Frequency of Social Gatherings with Friends and Family: Not on file  . Attends Religious Services: Not on file  . Active Member of Clubs or Organizations: Not on file  . Attends Archivist Meetings: Not on file  . Marital Status: Not on file  Intimate Partner Violence:   . Fear of Current or Ex-Partner: Not on file  . Emotionally Abused: Not on file  . Physically Abused: Not on file  . Sexually Abused: Not on file     PHYSICAL EXAM   Vitals:   09/27/19 1047  BP: 119/65  Pulse: 77  Temp: 97.9 F (36.6 C)  Weight: 126 lb (57.2 kg)  Height: 5\' 6"  (1.676 m)    Not recorded      Body mass index is 20.34 kg/m.  PHYSICAL EXAMNIATION:  Gen: NAD, conversant, well nourised, well groomed                     Cardiovascular: Regular rate rhythm, no peripheral edema, warm, nontender. Eyes: Conjunctivae clear without exudates or hemorrhage Neck: Supple, no carotid bruits. Pulmonary: Clear to auscultation bilaterally   NEUROLOGICAL EXAM:  MENTAL STATUS: MMSE - Mini Mental State Exam 09/27/2019  Orientation to time 5  Orientation to Place 4  Registration 3  Attention/ Calculation 3  Recall 3  Language- name 2 objects 2  Language- repeat 1  Language- follow 3 step command 3  Language- read & follow direction 1  Write a sentence 1  Copy design 0  Total score 26   CRANIAL NERVES: CN II: Visual fields are full to confrontation. Pupils are round equal and briskly reactive to light. CN III, IV, VI: extraocular movement are normal. No ptosis. CN V:  Facial sensation is intact to light touch CN VII: Face is symmetric with normal eye closure  CN VIII: Hearing is normal to causal conversation. CN IX, X: Phonation is normal. CN XI: Head turning and shoulder shrug are intact  MOTOR: There is no pronator drift of out-stretched arms. Muscle bulk and tone are normal. Muscle strength is normal.  REFLEXES: Reflexes are 1 and symmetric at the biceps, triceps, knees, and ankles. Plantar responses are flexor.  SENSORY: Mildly length dependent decreased to light touch and  vibratory sensation at toes  COORDINATION: There is no trunk or limb dysmetria noted.  GAIT/STANCE: She needs push-up to get up from seated position, mildly cautious   DIAGNOSTIC DATA (LABS, IMAGING, TESTING) - I reviewed patient records, labs, notes, testing and imaging myself where available.   ASSESSMENT AND PLAN  Lillyn Bracher is a 85 y.o. female   Mild cognitive impairment  Mini-Mental Status 26/30  MRI of the brain to rule out structural lesion  Laboratory B12 methylmalonic acid level  Return to clinic in 3 months   Marcial Pacas, M.D. Ph.D.  [facility] [address] [place], [place] [address] Ph: [phone] Fax: [phone]  CC: Referring Provider

## 2019-10-02 ENCOUNTER — Telehealth: Payer: Self-pay | Admitting: *Deleted

## 2019-10-02 NOTE — Telephone Encounter (Signed)
Schedule AWV.  

## 2019-10-03 ENCOUNTER — Telehealth: Payer: Self-pay | Admitting: *Deleted

## 2019-10-03 LAB — VITAMIN B12: Vitamin B-12: 565 pg/mL (ref 232–1245)

## 2019-10-03 LAB — METHYLMALONIC ACID, SERUM: Methylmalonic Acid: 127 nmol/L (ref 0–378)

## 2019-10-03 NOTE — Progress Notes (Signed)
Labs are normal, including B12 and Methylmalonic acid.  Please update patient.

## 2019-10-03 NOTE — Telephone Encounter (Signed)
-----   Message from Star Age, MD sent at 10/03/2019  8:36 AM EST ----- Labs are normal, including B12 and Methylmalonic acid.  Please update patient.

## 2019-10-22 ENCOUNTER — Other Ambulatory Visit: Payer: Self-pay | Admitting: Family Medicine

## 2019-10-22 DIAGNOSIS — R1013 Epigastric pain: Secondary | ICD-10-CM

## 2019-10-29 DIAGNOSIS — Z23 Encounter for immunization: Secondary | ICD-10-CM | POA: Diagnosis not present

## 2019-11-26 DIAGNOSIS — Z23 Encounter for immunization: Secondary | ICD-10-CM | POA: Diagnosis not present

## 2019-12-03 DIAGNOSIS — R488 Other symbolic dysfunctions: Secondary | ICD-10-CM | POA: Diagnosis not present

## 2019-12-05 ENCOUNTER — Telehealth: Payer: Self-pay

## 2019-12-05 DIAGNOSIS — R488 Other symbolic dysfunctions: Secondary | ICD-10-CM | POA: Diagnosis not present

## 2019-12-05 NOTE — Telephone Encounter (Signed)
Received order from [facility], signed and faxed back to [phone] with confirmation. Placed in scan bin

## 2019-12-06 DIAGNOSIS — R488 Other symbolic dysfunctions: Secondary | ICD-10-CM | POA: Diagnosis not present

## 2019-12-07 DIAGNOSIS — R488 Other symbolic dysfunctions: Secondary | ICD-10-CM | POA: Diagnosis not present

## 2019-12-10 DIAGNOSIS — R488 Other symbolic dysfunctions: Secondary | ICD-10-CM | POA: Diagnosis not present

## 2019-12-10 DIAGNOSIS — R2681 Unsteadiness on feet: Secondary | ICD-10-CM | POA: Diagnosis not present

## 2019-12-10 DIAGNOSIS — M6281 Muscle weakness (generalized): Secondary | ICD-10-CM | POA: Diagnosis not present

## 2019-12-11 DIAGNOSIS — M6281 Muscle weakness (generalized): Secondary | ICD-10-CM | POA: Diagnosis not present

## 2019-12-11 DIAGNOSIS — R2681 Unsteadiness on feet: Secondary | ICD-10-CM | POA: Diagnosis not present

## 2019-12-11 DIAGNOSIS — R488 Other symbolic dysfunctions: Secondary | ICD-10-CM | POA: Diagnosis not present

## 2019-12-12 DIAGNOSIS — R488 Other symbolic dysfunctions: Secondary | ICD-10-CM | POA: Diagnosis not present

## 2019-12-12 DIAGNOSIS — R2681 Unsteadiness on feet: Secondary | ICD-10-CM | POA: Diagnosis not present

## 2019-12-12 DIAGNOSIS — M6281 Muscle weakness (generalized): Secondary | ICD-10-CM | POA: Diagnosis not present

## 2019-12-13 DIAGNOSIS — R488 Other symbolic dysfunctions: Secondary | ICD-10-CM | POA: Diagnosis not present

## 2019-12-13 DIAGNOSIS — R2681 Unsteadiness on feet: Secondary | ICD-10-CM | POA: Diagnosis not present

## 2019-12-13 DIAGNOSIS — M6281 Muscle weakness (generalized): Secondary | ICD-10-CM | POA: Diagnosis not present

## 2019-12-14 DIAGNOSIS — M6281 Muscle weakness (generalized): Secondary | ICD-10-CM | POA: Diagnosis not present

## 2019-12-14 DIAGNOSIS — R488 Other symbolic dysfunctions: Secondary | ICD-10-CM | POA: Diagnosis not present

## 2019-12-14 DIAGNOSIS — R2681 Unsteadiness on feet: Secondary | ICD-10-CM | POA: Diagnosis not present

## 2019-12-15 DIAGNOSIS — R488 Other symbolic dysfunctions: Secondary | ICD-10-CM | POA: Diagnosis not present

## 2019-12-15 DIAGNOSIS — M6281 Muscle weakness (generalized): Secondary | ICD-10-CM | POA: Diagnosis not present

## 2019-12-15 DIAGNOSIS — R2681 Unsteadiness on feet: Secondary | ICD-10-CM | POA: Diagnosis not present

## 2019-12-17 DIAGNOSIS — R2681 Unsteadiness on feet: Secondary | ICD-10-CM | POA: Diagnosis not present

## 2019-12-17 DIAGNOSIS — R488 Other symbolic dysfunctions: Secondary | ICD-10-CM | POA: Diagnosis not present

## 2019-12-17 DIAGNOSIS — M6281 Muscle weakness (generalized): Secondary | ICD-10-CM | POA: Diagnosis not present

## 2019-12-18 DIAGNOSIS — M6281 Muscle weakness (generalized): Secondary | ICD-10-CM | POA: Diagnosis not present

## 2019-12-18 DIAGNOSIS — R2681 Unsteadiness on feet: Secondary | ICD-10-CM | POA: Diagnosis not present

## 2019-12-18 DIAGNOSIS — R488 Other symbolic dysfunctions: Secondary | ICD-10-CM | POA: Diagnosis not present

## 2019-12-19 DIAGNOSIS — R488 Other symbolic dysfunctions: Secondary | ICD-10-CM | POA: Diagnosis not present

## 2019-12-19 DIAGNOSIS — M6281 Muscle weakness (generalized): Secondary | ICD-10-CM | POA: Diagnosis not present

## 2019-12-19 DIAGNOSIS — R2681 Unsteadiness on feet: Secondary | ICD-10-CM | POA: Diagnosis not present

## 2019-12-22 DIAGNOSIS — R488 Other symbolic dysfunctions: Secondary | ICD-10-CM | POA: Diagnosis not present

## 2019-12-22 DIAGNOSIS — R2681 Unsteadiness on feet: Secondary | ICD-10-CM | POA: Diagnosis not present

## 2019-12-22 DIAGNOSIS — M6281 Muscle weakness (generalized): Secondary | ICD-10-CM | POA: Diagnosis not present

## 2019-12-25 DIAGNOSIS — R2681 Unsteadiness on feet: Secondary | ICD-10-CM | POA: Diagnosis not present

## 2019-12-25 DIAGNOSIS — M6281 Muscle weakness (generalized): Secondary | ICD-10-CM | POA: Diagnosis not present

## 2019-12-25 DIAGNOSIS — R488 Other symbolic dysfunctions: Secondary | ICD-10-CM | POA: Diagnosis not present

## 2019-12-27 DIAGNOSIS — R488 Other symbolic dysfunctions: Secondary | ICD-10-CM | POA: Diagnosis not present

## 2019-12-27 DIAGNOSIS — M6281 Muscle weakness (generalized): Secondary | ICD-10-CM | POA: Diagnosis not present

## 2019-12-27 DIAGNOSIS — R2681 Unsteadiness on feet: Secondary | ICD-10-CM | POA: Diagnosis not present

## 2019-12-31 DIAGNOSIS — R488 Other symbolic dysfunctions: Secondary | ICD-10-CM | POA: Diagnosis not present

## 2019-12-31 DIAGNOSIS — R2681 Unsteadiness on feet: Secondary | ICD-10-CM | POA: Diagnosis not present

## 2019-12-31 DIAGNOSIS — M6281 Muscle weakness (generalized): Secondary | ICD-10-CM | POA: Diagnosis not present

## 2020-01-01 ENCOUNTER — Ambulatory Visit: Payer: Self-pay | Admitting: Neurology

## 2020-01-02 DIAGNOSIS — R488 Other symbolic dysfunctions: Secondary | ICD-10-CM | POA: Diagnosis not present

## 2020-01-02 DIAGNOSIS — R2681 Unsteadiness on feet: Secondary | ICD-10-CM | POA: Diagnosis not present

## 2020-01-02 DIAGNOSIS — M6281 Muscle weakness (generalized): Secondary | ICD-10-CM | POA: Diagnosis not present

## 2020-01-03 DIAGNOSIS — R2681 Unsteadiness on feet: Secondary | ICD-10-CM | POA: Diagnosis not present

## 2020-01-03 DIAGNOSIS — M6281 Muscle weakness (generalized): Secondary | ICD-10-CM | POA: Diagnosis not present

## 2020-01-03 DIAGNOSIS — R488 Other symbolic dysfunctions: Secondary | ICD-10-CM | POA: Diagnosis not present

## 2020-01-08 DIAGNOSIS — R2681 Unsteadiness on feet: Secondary | ICD-10-CM | POA: Diagnosis not present

## 2020-01-08 DIAGNOSIS — M6281 Muscle weakness (generalized): Secondary | ICD-10-CM | POA: Diagnosis not present

## 2020-01-08 DIAGNOSIS — R488 Other symbolic dysfunctions: Secondary | ICD-10-CM | POA: Diagnosis not present

## 2020-01-10 DIAGNOSIS — R488 Other symbolic dysfunctions: Secondary | ICD-10-CM | POA: Diagnosis not present

## 2020-01-15 DIAGNOSIS — R488 Other symbolic dysfunctions: Secondary | ICD-10-CM | POA: Diagnosis not present

## 2020-01-17 DIAGNOSIS — R488 Other symbolic dysfunctions: Secondary | ICD-10-CM | POA: Diagnosis not present

## 2020-01-22 ENCOUNTER — Other Ambulatory Visit: Payer: Self-pay | Admitting: Family Medicine

## 2020-01-22 DIAGNOSIS — R1013 Epigastric pain: Secondary | ICD-10-CM

## 2020-01-22 DIAGNOSIS — R488 Other symbolic dysfunctions: Secondary | ICD-10-CM | POA: Diagnosis not present

## 2020-01-22 NOTE — Telephone Encounter (Signed)
Requested Prescriptions  Pending Prescriptions Disp Refills  . sucralfate (CARAFATE) 1 g tablet [Pharmacy Med Name: SUCRALFATE 1GM TABLETS] 90 tablet 0    Sig: TAKE 1 TABLET(1 GRAM) BY MOUTH DAILY WITH BREAKFAST     Gastroenterology: Antiacids Passed - 01/22/2020  3:45 AM      Passed - Valid encounter within last 12 months    Recent Outpatient Visits          3 months ago Acute delirium   [facility] [place], NP   5 months ago Advice given about COVID-19 virus by telephone   [facility], Arlie Solomons, MD   1 year ago Leg pain, bilateral   [facility] [place], [place] D, PA-C   1 year ago Bigeminy   [facility] [place], [place] D, PA-C   1 year ago Chest heaviness   [facility], [place] D, [place]

## 2020-01-24 DIAGNOSIS — R488 Other symbolic dysfunctions: Secondary | ICD-10-CM | POA: Diagnosis not present

## 2020-02-05 ENCOUNTER — Ambulatory Visit: Payer: Medicare Other | Admitting: Neurology

## 2020-04-23 ENCOUNTER — Other Ambulatory Visit: Payer: Self-pay

## 2020-04-23 DIAGNOSIS — R1013 Epigastric pain: Secondary | ICD-10-CM

## 2020-04-23 MED ORDER — SUCRALFATE 1 G PO TABS: 1.0000 g | ORAL_TABLET | Freq: Every day | ORAL | 0 refills | Status: DC

## 2020-06-26 DIAGNOSIS — Z23 Encounter for immunization: Secondary | ICD-10-CM | POA: Diagnosis not present

## 2020-08-15 DIAGNOSIS — Z23 Encounter for immunization: Secondary | ICD-10-CM | POA: Diagnosis not present

## 2020-09-01 ENCOUNTER — Other Ambulatory Visit: Payer: Self-pay

## 2020-09-01 ENCOUNTER — Encounter: Payer: Self-pay | Admitting: Registered Nurse

## 2020-09-01 ENCOUNTER — Telehealth (INDEPENDENT_AMBULATORY_CARE_PROVIDER_SITE_OTHER): Payer: Medicare Other | Admitting: Registered Nurse

## 2020-09-01 DIAGNOSIS — Z7409 Other reduced mobility: Secondary | ICD-10-CM

## 2020-09-01 DIAGNOSIS — J069 Acute upper respiratory infection, unspecified: Secondary | ICD-10-CM

## 2020-09-01 DIAGNOSIS — Z9114 Patient's other noncompliance with medication regimen: Secondary | ICD-10-CM

## 2020-09-01 DIAGNOSIS — R4189 Other symptoms and signs involving cognitive functions and awareness: Secondary | ICD-10-CM | POA: Diagnosis not present

## 2020-09-01 DIAGNOSIS — Z91148 Patient's other noncompliance with medication regimen for other reason: Secondary | ICD-10-CM

## 2020-09-01 DIAGNOSIS — R413 Other amnesia: Secondary | ICD-10-CM

## 2020-09-01 DIAGNOSIS — Z9181 History of falling: Secondary | ICD-10-CM

## 2020-09-01 MED ORDER — FLUTICASONE PROPIONATE 50 MCG/ACT NA SUSP: 2.0000 | Freq: Every day | NASAL | 6 refills | Status: DC

## 2020-09-01 MED ORDER — AMOXICILLIN-POT CLAVULANATE 875-125 MG PO TABS: 1.0000 | ORAL_TABLET | Freq: Two times a day (BID) | ORAL | 0 refills | Status: DC

## 2020-09-01 NOTE — Patient Instructions (Signed)
° ° ° °  If you have lab work done today you will be contacted with your lab results within the next 2 weeks.  If you have not heard from us then please contact us. The fastest way to get your results is to register for My Chart. ° ° °IF you received an x-ray today, you will receive an invoice from [facility]. Please contact [facility] at [phone] with questions or concerns regarding your invoice.  ° °IF you received labwork today, you will receive an invoice from [facility]. Please contact [facility] at [phone] with questions or concerns regarding your invoice.  ° °Our billing staff will not be able to assist you with questions regarding bills from these companies. ° °You will be contacted with the lab results as soon as they are available. The fastest way to get your results is to activate your My Chart account. Instructions are located on the last page of this paperwork. If you have not heard from us regarding the results in 2 weeks, please contact this office. °  ° ° ° °

## 2020-09-01 NOTE — Progress Notes (Signed)
Telemedicine Encounter- SOAP NOTE Established Patient  This video encounter was conducted with the patient's (or proxy's) verbal consent via audio telecommunications: yes  Patient was instructed to have this encounter in a suitably private space; and to only have persons present to whom they give permission to participate. In addition, patient identity was confirmed by use of name plus two identifiers (DOB and address).  I discussed the limitations, risks, security and privacy concerns of performing an evaluation and management service by telephone and the availability of in person appointments. I also discussed with the patient that there may be a patient responsible charge related to this service. The patient expressed understanding and agreed to proceed.  I spent a total of 43 minutes talking with the patient and their proxy.  Patient at home with son Provider in office.  Chief Complaint  Patient presents with   Nasal Congestion    going on 3 weeks    upset stomach   Fatigue   Memory Loss    son has concerns of this. Triaged with son     Subjective   Rebecca Munoz is a 86 y.o. established patient. Video visit today for a few issues:  Upper respiratory congestion: happens around this time every year. Denies sinus pressure and pain but has substantial rhinorrhea and pnd, leading to a cough that has started to give some pain in the chest. Nonproductive cough. No shob, doe, chest congestion. Has been going on for around 3 weeks. Pt may or may not have been taking claritin during that time - at times endorses she's taken it, other times states she is only taking melatonin.  Fatigue: thinks this to be related to her ongoing infection, but states she has been feeling this way generally for some time - has started PT through her residence, this has helped out a lot. Does daily exercises in bed.   Upset stomach: believes this to be related to her URI as at times she swallows pnd and  this has always upset her stomach. No nvd. No change in appetite.   Memory Loss: she does note that this is an issue but she attributes it to age - however, her son is more concerned. States that this have changed at an increasing rate. Unsure of how to proceed. Has seen Dr. Krista Blue in the past. Does note that she has increased paranoia type behavior and fixation on particular individuals - unfortunately I am unable to determine the veracity of her claims, while pt is very intent on them, her son is less convinced.   Overall, though, Ms Mareno seems to be doing well for her age.    HPI   Patient Active Problem List   Diagnosis Date Noted   Word finding difficulty 09/27/2019   Memory loss 09/27/2019   Acute delirium 09/25/2019   Bigeminy 05/14/2018   Near syncope 05/14/2018    Past Medical History:  Diagnosis Date   Cancer [facility])    breast   Osteoporosis    Seasonal allergies     Current Outpatient Medications  Medication Sig Dispense Refill   CALCIUM PO Take 1 tablet by mouth daily.     diphenhydrAMINE HCl (BENADRYL PO) Take 1 tablet by mouth at bedtime as needed.     loratadine (CLARITIN) 10 MG tablet TAKE 1 TABLET BY MOUTH EVERY DAY 90 tablet 0   Melatonin-Pyridoxine (MELATIN PO) Take by mouth at bedtime as needed.     Multiple Vitamins-Minerals (MULTIVITAMIN WITH MINERALS) tablet Take  1 tablet by mouth daily.     sucralfate (CARAFATE) 1 g tablet Take 1 tablet (1 g total) by mouth daily with breakfast. 30 tablet 0   TURMERIC PO Take by mouth daily.     amoxicillin-clavulanate (AUGMENTIN) 875-125 MG tablet Take 1 tablet by mouth 2 (two) times daily. 10 tablet 0   aspirin 81 MG tablet Take 81 mg by mouth daily. (Patient not taking: Reported on 09/01/2020)     Docusate Calcium (STOOL SOFTENER PO) Take 1 tablet by mouth daily as needed. (Patient not taking: Reported on 09/01/2020)     fluticasone (FLONASE) 50 MCG/ACT nasal spray Place 2 sprays into both nostrils  daily. 16 g 6   No current facility-administered medications for this visit.    No Known Allergies  Social History   Socioeconomic History   Marital status: Widowed    Spouse name: Not on file   Number of children: Not on file   Years of education: three semesters of college   Highest education level: Not on file  Occupational History   Occupation: Retired  Tobacco Use   Smoking status: Never Smoker   Smokeless tobacco: Never Used  Scientific laboratory technician Use: Never used  Substance and Sexual Activity   Alcohol use: No    Alcohol/week: 0.0 standard drinks   Drug use: No   Sexual activity: Not Currently  Other Topics Concern   Not on file  Social History Narrative   She resides in an independent living facility.  She has staff that helps her.   Right-handed.   Caffeine use:  No daily use.   Two adopted children.   Social Determinants of Health   Financial Resource Strain:    Difficulty of Paying Living Expenses: Not on file  Food Insecurity:    Worried About Charity fundraiser in the Last Year: Not on file   [employer] of Food in the Last Year: Not on file  Transportation Needs:    Lack of Transportation (Medical): Not on file   Lack of Transportation (Non-Medical): Not on file  Physical Activity:    Days of Exercise per Week: Not on file   Minutes of Exercise per Session: Not on file  Stress:    Feeling of Stress : Not on file  Social Connections:    Frequency of Communication with Friends and Family: Not on file   Frequency of Social Gatherings with Friends and Family: Not on file   Attends Religious Services: Not on file   Active Member of Clubs or Organizations: Not on file   Attends Archivist Meetings: Not on file   Marital Status: Not on file  Intimate Partner Violence:    Fear of Current or Ex-Partner: Not on file   Emotionally Abused: Not on file   Physically Abused: Not on file   Sexually Abused: Not on file     ROS Per hpi   Objective   Vitals as reported by the patient: There were no vitals filed for this visit.  [name] was seen today for nasal congestion, upset stomach, fatigue and memory loss.  Diagnoses and all orders for this visit:  Acute upper respiratory infection -     amoxicillin-clavulanate (AUGMENTIN) 875-125 MG tablet; Take 1 tablet by mouth 2 (two) times daily. -     fluticasone (FLONASE) 50 MCG/ACT nasal spray; Place 2 sprays into both nostrils daily.  Memory loss -     Ambulatory referral to [place] -  Ambulatory referral to Neurology  Cognitive decline -     Ambulatory referral to [place] -     Ambulatory referral to Neurology   PLAN  Had in depth discussions about medication management, benefits of home health, and lifestyle modifications to enhance health  Will give short course of augmentin as I fear the upper respiratory infection may have become bacterial  Refer back to Dr. Krista Blue - appears that Ms Coltrane never did the MRI brain that Dr. Krista Blue ordered. Given some of her fatigue and paranoia, concern for an Alzheimers but will defer firm diagnostics to Dr. Krista Blue. Would be late for dementia onset. Unfortunately I do not have this patient in the office so we cannot check labs or exam  Present to office in 3 months at the latest for exam and labs  Patient encouraged to call clinic with any questions, comments, or concerns.  I discussed the assessment and treatment plan with the patient. The patient was provided an opportunity to ask questions and all were answered. The patient agreed with the plan and demonstrated an understanding of the instructions.   The patient was advised to call back or seek an in-person evaluation if the symptoms worsen or if the condition fails to improve as anticipated.  I provided 43 minutes of face-to-face time during this encounter.  Maximiano Coss, NP  Primary Care at [facility]

## 2020-09-11 ENCOUNTER — Telehealth: Payer: Self-pay | Admitting: Registered Nurse

## 2020-09-11 DIAGNOSIS — R413 Other amnesia: Secondary | ICD-10-CM | POA: Diagnosis not present

## 2020-09-11 DIAGNOSIS — R410 Disorientation, unspecified: Secondary | ICD-10-CM | POA: Diagnosis not present

## 2020-09-11 DIAGNOSIS — J069 Acute upper respiratory infection, unspecified: Secondary | ICD-10-CM | POA: Diagnosis not present

## 2020-09-11 DIAGNOSIS — R4701 Aphasia: Secondary | ICD-10-CM | POA: Diagnosis not present

## 2020-09-11 DIAGNOSIS — R4181 Age-related cognitive decline: Secondary | ICD-10-CM | POA: Diagnosis not present

## 2020-09-11 DIAGNOSIS — Z853 Personal history of malignant neoplasm of breast: Secondary | ICD-10-CM | POA: Diagnosis not present

## 2020-09-11 DIAGNOSIS — Z9181 History of falling: Secondary | ICD-10-CM | POA: Diagnosis not present

## 2020-09-11 DIAGNOSIS — J302 Other seasonal allergic rhinitis: Secondary | ICD-10-CM | POA: Diagnosis not present

## 2020-09-11 DIAGNOSIS — M810 Age-related osteoporosis without current pathological fracture: Secondary | ICD-10-CM | POA: Diagnosis not present

## 2020-09-11 NOTE — Telephone Encounter (Signed)
Kristal from [facility] called about pts home health. They are wanting to come once a week for 4 weeks. She also stated that when she came today the pt was resistive with the nurses care. Stated she doesn't need another Dr. Nurse told her that she wasn't a dr. She was the nurse to help her. Pt would let nurse help her with her medications she stated she does that herself. Nurse stated she thinks a speech therapy would be helpful for pt and stated that pt told her she is having issues swallowing she always needs to clear her throat to swallow or to talk. Please advise.  Nurses phone number is: [phone]

## 2020-09-12 NOTE — Telephone Encounter (Signed)
Called LM approving requested therapy

## 2020-09-16 DIAGNOSIS — R410 Disorientation, unspecified: Secondary | ICD-10-CM | POA: Diagnosis not present

## 2020-09-16 DIAGNOSIS — M810 Age-related osteoporosis without current pathological fracture: Secondary | ICD-10-CM | POA: Diagnosis not present

## 2020-09-16 DIAGNOSIS — R413 Other amnesia: Secondary | ICD-10-CM | POA: Diagnosis not present

## 2020-09-16 DIAGNOSIS — R4181 Age-related cognitive decline: Secondary | ICD-10-CM | POA: Diagnosis not present

## 2020-09-16 DIAGNOSIS — J069 Acute upper respiratory infection, unspecified: Secondary | ICD-10-CM | POA: Diagnosis not present

## 2020-09-16 DIAGNOSIS — R4701 Aphasia: Secondary | ICD-10-CM | POA: Diagnosis not present

## 2020-09-17 ENCOUNTER — Telehealth: Payer: Self-pay | Admitting: Registered Nurse

## 2020-09-17 NOTE — Telephone Encounter (Signed)
Rebecca Munoz with [facility] is calling and the codes for memory loss and cognitive decline are not billing codes they can use. Rebecca Munoz would like provider to resubmit new order or addend the office note from 09-01-2020 and put order in epic

## 2020-09-18 ENCOUNTER — Telehealth: Payer: Self-pay | Admitting: Family Medicine

## 2020-09-18 DIAGNOSIS — R4189 Other symptoms and signs involving cognitive functions and awareness: Secondary | ICD-10-CM | POA: Insufficient documentation

## 2020-09-18 DIAGNOSIS — Z7409 Other reduced mobility: Secondary | ICD-10-CM | POA: Insufficient documentation

## 2020-09-18 DIAGNOSIS — Z9181 History of falling: Secondary | ICD-10-CM | POA: Insufficient documentation

## 2020-09-18 NOTE — Telephone Encounter (Signed)
Received call from [facility] with [facility] [facility]. She needs a medical diagnoses for this patient that Medicare will allow them to use for approval to send chart to Medicare. Please advise at [phone].

## 2020-09-18 NOTE — Telephone Encounter (Signed)
Rich please advise on below msg if dx codes ca be changed

## 2020-09-18 NOTE — Telephone Encounter (Signed)
Have addended with new dx codes  Thanks,  Denice Paradise

## 2020-09-19 NOTE — Telephone Encounter (Signed)
Rebecca Munoz with [facility] has been informed that these codes has been updated

## 2020-09-19 NOTE — Telephone Encounter (Signed)
Please advise or change order DX

## 2020-09-23 DIAGNOSIS — R4181 Age-related cognitive decline: Secondary | ICD-10-CM | POA: Diagnosis not present

## 2020-09-23 DIAGNOSIS — M810 Age-related osteoporosis without current pathological fracture: Secondary | ICD-10-CM | POA: Diagnosis not present

## 2020-09-23 DIAGNOSIS — R413 Other amnesia: Secondary | ICD-10-CM | POA: Diagnosis not present

## 2020-09-23 DIAGNOSIS — R4701 Aphasia: Secondary | ICD-10-CM | POA: Diagnosis not present

## 2020-09-23 DIAGNOSIS — J069 Acute upper respiratory infection, unspecified: Secondary | ICD-10-CM | POA: Diagnosis not present

## 2020-09-23 DIAGNOSIS — R410 Disorientation, unspecified: Secondary | ICD-10-CM | POA: Diagnosis not present

## 2020-09-29 DIAGNOSIS — R4701 Aphasia: Secondary | ICD-10-CM | POA: Diagnosis not present

## 2020-09-29 DIAGNOSIS — M810 Age-related osteoporosis without current pathological fracture: Secondary | ICD-10-CM | POA: Diagnosis not present

## 2020-09-29 DIAGNOSIS — R4181 Age-related cognitive decline: Secondary | ICD-10-CM | POA: Diagnosis not present

## 2020-09-29 DIAGNOSIS — R410 Disorientation, unspecified: Secondary | ICD-10-CM | POA: Diagnosis not present

## 2020-09-29 DIAGNOSIS — J069 Acute upper respiratory infection, unspecified: Secondary | ICD-10-CM | POA: Diagnosis not present

## 2020-09-29 DIAGNOSIS — R413 Other amnesia: Secondary | ICD-10-CM | POA: Diagnosis not present

## 2020-10-08 DIAGNOSIS — J069 Acute upper respiratory infection, unspecified: Secondary | ICD-10-CM | POA: Diagnosis not present

## 2020-10-08 DIAGNOSIS — M810 Age-related osteoporosis without current pathological fracture: Secondary | ICD-10-CM | POA: Diagnosis not present

## 2020-10-08 DIAGNOSIS — R4181 Age-related cognitive decline: Secondary | ICD-10-CM | POA: Diagnosis not present

## 2020-10-08 DIAGNOSIS — R4701 Aphasia: Secondary | ICD-10-CM | POA: Diagnosis not present

## 2020-10-08 DIAGNOSIS — R413 Other amnesia: Secondary | ICD-10-CM | POA: Diagnosis not present

## 2020-10-08 DIAGNOSIS — R410 Disorientation, unspecified: Secondary | ICD-10-CM | POA: Diagnosis not present

## 2020-10-09 ENCOUNTER — Telehealth: Payer: Self-pay | Admitting: Family Medicine

## 2020-10-09 NOTE — Telephone Encounter (Signed)
[facility] called to request OTC or Rx for heartburn for the pt as she has been complaining of frequent heart burn symptoms please advise

## 2020-10-09 NOTE — Telephone Encounter (Signed)
Rebecca Munoz with [facility] called to see if the Pt PCP can look into providing her with an Rx for heartburn or suggesting an over the counter Prilosec / Pt has been complaining of heartburn like symptoms and has been spitting a lot and complaining of burning feeling  / please advise

## 2020-10-13 NOTE — Telephone Encounter (Signed)
Imagene Gurney called from [facility] for pt wondering about this message. Her call back number is [phone]. Please advise.

## 2020-10-15 ENCOUNTER — Other Ambulatory Visit: Payer: Self-pay | Admitting: Registered Nurse

## 2020-10-15 DIAGNOSIS — R1013 Epigastric pain: Secondary | ICD-10-CM

## 2020-10-15 MED ORDER — OMEPRAZOLE 40 MG PO CPDR: 40.0000 mg | DELAYED_RELEASE_CAPSULE | Freq: Every day | ORAL | 3 refills | Status: DC

## 2020-10-15 MED ORDER — SUCRALFATE 1 G PO TABS: 1.0000 g | ORAL_TABLET | Freq: Every day | ORAL | 0 refills | Status: DC

## 2020-10-15 NOTE — Telephone Encounter (Signed)
Sent omeprazole and sucralfate to pharmacy  Sucralfate is temporary - can continue on omeprazole regularly Caution should be given to avoid falls at all cost  Thank you  Luan Pulling

## 2020-10-15 NOTE — Telephone Encounter (Signed)
I have attempted to call pt to gather more information on what she had tried and failed besides the Prilosec.   Rich please advise on if you would recommend anything for acid reflux

## 2020-11-17 ENCOUNTER — Ambulatory Visit (INDEPENDENT_AMBULATORY_CARE_PROVIDER_SITE_OTHER): Payer: Medicare Other | Admitting: Neurology

## 2020-11-17 ENCOUNTER — Encounter: Payer: Self-pay | Admitting: Neurology

## 2020-11-17 VITALS — BP 142/62 | HR 60 | Ht 66.0 in | Wt 125.5 lb

## 2020-11-17 DIAGNOSIS — R269 Unspecified abnormalities of gait and mobility: Secondary | ICD-10-CM | POA: Insufficient documentation

## 2020-11-17 DIAGNOSIS — R413 Other amnesia: Secondary | ICD-10-CM | POA: Diagnosis not present

## 2020-11-17 NOTE — Progress Notes (Signed)
Chief Complaint  Patient presents with   New Patient (Initial Visit)    MMSE 24/30 - 10 animals. She is here with her son, Rebecca Munoz, to have her cognitive decline evaluated. She resides at [facility] (assisted living in [place]).     HISTORICAL  Rebecca Munoz is a 87 year old female, seen in request by nurse practitioner Maximiano Coss, for evaluation of memory loss, she is accompanied by her son Rebecca Munoz at today's visit November 17, 2020  I reviewed and summarized the referring note.  Past medical history History of breast cancer Osteoporosis  I saw her in December 2020 for complaints of word finding difficulty since 23-Jan-2014, she used to with her husband all over the [place] before her husband passed away in 2011/01/24.  She was trained and often speak in public for [employer].  She was noted to have intermittent word finding difficulties since 01-23-14, getting worse since March 2020, because Covid 19 isolations, she was noted to have intermittent confusion, word finding difficulties, she tends to repeat herself, self-conscious speaking in front of the people, she has mild right-sided low back pain, radiating pain to right hip, ambulate with a walker  Mini-Mental status was 26 in December 2020, she was at independent living.  MRI of the brain was ordered, but was not completed.  She continues to have slow worsening memory loss, Mini-Mental Status Examination in 01-24-2019 was 26, today is 24, she used to be able to manage her medications, now with minor changes, she is easily confused  She still live at independent living, able to take care of her basic needs, usually drink cold milk with cereal in the morning, divided her lunch into 2 meals, tends to drink excessive amount of water in the middle of the night, using bathroom  Today her main concern is increased secretion, phlegm, nasal drip, for that reason, she was put on Claritin, Prilosec, even Benadryl at nighttime with  limited help Laboratory evaluation in 24-Jan-2019: Normal B12, TSH, CMP, creatinine 0.74, A1c 5.8,  REVIEW OF SYSTEMS: Full 14 system review of systems performed and notable only for as above All other review of systems were negative.  ALLERGIES: No Known Allergies  HOME MEDICATIONS: Current Outpatient Medications  Medication Sig Dispense Refill   Apoaequorin (PREVAGEN EXTRA STRENGTH PO) Take 1 tablet by mouth daily.     CALCIUM PO Take 1 tablet by mouth daily.     diphenhydrAMINE HCl (BENADRYL PO) Take 1 tablet by mouth at bedtime as needed.     Docusate Calcium (STOOL SOFTENER PO) Take 1 tablet by mouth daily as needed.     fluticasone (FLONASE) 50 MCG/ACT nasal spray Place 2 sprays into both nostrils daily. 16 g 6   loratadine (CLARITIN) 10 MG tablet TAKE 1 TABLET BY MOUTH EVERY DAY 90 tablet 0   Melatonin-Pyridoxine (MELATIN PO) Take by mouth at bedtime as needed.     Multiple Vitamins-Minerals (MULTIVITAMIN WITH MINERALS) tablet Take 1 tablet by mouth daily.     omeprazole (PRILOSEC) 40 MG capsule Take 1 capsule (40 mg total) by mouth daily. 90 capsule 3   sucralfate (CARAFATE) 1 g tablet Take 1 tablet (1 g total) by mouth daily with breakfast. 30 tablet 0   TURMERIC PO Take by mouth daily.     No current facility-administered medications for this visit.    PAST MEDICAL HISTORY: Past Medical History:  Diagnosis Date   Cancer [facility])    breast   Cognitive decline    Fracture,  humerus    right   Osteoporosis    Seasonal allergies     PAST SURGICAL HISTORY: Past Surgical History:  Procedure Laterality Date   APPENDECTOMY     BACK SURGERY     BREAST SURGERY Right    lumpectomy    EYE SURGERY     JOINT REPLACEMENT     MASTECTOMY      FAMILY HISTORY: Family History  Problem Relation Age of Onset   Heart disease Mother    Colon cancer Father     SOCIAL HISTORY: Social History   Socioeconomic History   Marital status: Widowed    Spouse name:  Not on file   Number of children: 2   Years of education: three semesters of college   Highest education level: Not on file  Occupational History   Occupation: Retired  Tobacco Use   Smoking status: Never Smoker   Smokeless tobacco: Never Used  Scientific laboratory technician Use: Never used  Substance and Sexual Activity   Alcohol use: No    Alcohol/week: 0.0 standard drinks   Drug use: No   Sexual activity: Not Currently  Other Topics Concern   Not on file  Social History Narrative   She resides in an assisted living facility.  She has staff that helps her.   Right-handed.   Caffeine use:  No daily use.   Two adopted children.   Social Determinants of Health   Financial Resource Strain: Not on file  Food Insecurity: Not on file  Transportation Needs: Not on file  Physical Activity: Not on file  Stress: Not on file  Social Connections: Not on file  Intimate Partner Violence: Not on file     PHYSICAL EXAM   Vitals:   11/17/20 1330  BP: (!) 142/62  Pulse: 60  Weight: 125 lb 8 oz (56.9 kg)  Height: 5\' 6"  (1.676 m)   Not recorded     Body mass index is 20.26 kg/m.  PHYSICAL EXAMNIATION:  Gen: NAD, conversant, well nourised, well groomed                     Cardiovascular: Regular rate rhythm, no peripheral edema, warm, nontender. Eyes: Conjunctivae clear without exudates or hemorrhage Neck: Supple, no carotid bruits. Pulmonary: Clear to auscultation bilaterally   NEUROLOGICAL EXAM:  MENTAL STATUS: MMSE - Mini Mental State Exam 11/17/2020 09/27/2019  Orientation to time 4 5  Orientation to Place 4 4  Registration 3 3  Attention/ Calculation 5 3  Recall 0 3  Language- name 2 objects 2 2  Language- repeat 1 1  Language- follow 3 step command 3 3  Language- read & follow direction 1 1  Write a sentence 1 1  Copy design 0 0  Copy design-comments named 10 animals -  Total score 24 26     CRANIAL NERVES: CN II: Visual fields are full to  confrontation. Pupils are round equal and briskly reactive to light. CN III, IV, VI: extraocular movement are normal. No ptosis. CN V: Facial sensation is intact to light touch CN VII: Face is symmetric with normal eye closure  CN VIII: Hearing is normal to causal conversation. CN IX, X: Phonation is normal. CN XI: Head turning and shoulder shrug are intact  MOTOR: There is no pronator drift of out-stretched arms. Muscle bulk and tone are normal. Muscle strength is normal.  REFLEXES: Reflexes are 2+ and symmetric at the biceps, triceps, knees, and ankles. Plantar responses  are flexor.  SENSORY: Intact to light touch, pinprick and vibratory sensation are intact in fingers and toes.  COORDINATION: There is no trunk or limb dysmetria noted.  GAIT/STANCE: She relies on her walker to get up from seated position, fairly steady with her walker,   DIAGNOSTIC DATA (LABS, IMAGING, TESTING) - I reviewed patient records, labs, notes, testing and imaging myself where available.   ASSESSMENT AND PLAN  Rebecca Munoz is a 87 y.o. female   Mild cognitive impairment  Slow decline, Mini-Mental Status Examination was 24/30,  Laboratory evaluation showed no treatable etiology  No focal signs, most likely age-related process, with underlying central nervous system degenerative process  Emphasized importance of moderate exercise, water intake,  Also recommended stopping Benadryl to avoid side effect, change in eating/sleeping habits to promote sleep quality   Marcial Pacas, M.D. Ph.D.  [facility] [address] [place], [place] [address] Ph: [phone] Fax: [phone]  CC:  Maximiano Coss, NP [place],  [place] [address]

## 2020-11-18 ENCOUNTER — Other Ambulatory Visit: Payer: Self-pay | Admitting: Registered Nurse

## 2020-11-18 DIAGNOSIS — R1013 Epigastric pain: Secondary | ICD-10-CM

## 2021-01-05 ENCOUNTER — Other Ambulatory Visit: Payer: Self-pay | Admitting: Registered Nurse

## 2021-01-05 DIAGNOSIS — J069 Acute upper respiratory infection, unspecified: Secondary | ICD-10-CM

## 2021-01-05 NOTE — Telephone Encounter (Signed)
Sent the the wrong pool.

## 2021-10-28 ENCOUNTER — Other Ambulatory Visit: Payer: Self-pay | Admitting: Registered Nurse

## 2021-10-28 DIAGNOSIS — R1013 Epigastric pain: Secondary | ICD-10-CM

## 2022-06-29 ENCOUNTER — Ambulatory Visit: Payer: TRICARE For Life (TFL)

## 2022-07-01 ENCOUNTER — Ambulatory Visit
Admission: RE | Admit: 2022-07-01 | Discharge: 2022-07-01 | Disposition: A | Discharge status: Home / Self Care | Payer: Medicare Other | Source: Ambulatory Visit | Attending: Urgent Care | Admitting: Urgent Care

## 2022-07-01 VITALS — BP 137/68 | HR 53 | Temp 98.1°F | Resp 14

## 2022-07-01 DIAGNOSIS — N949 Unspecified condition associated with female genital organs and menstrual cycle: Secondary | ICD-10-CM | POA: Diagnosis present

## 2022-07-01 DIAGNOSIS — R300 Dysuria: Secondary | ICD-10-CM | POA: Insufficient documentation

## 2022-07-01 DIAGNOSIS — R410 Disorientation, unspecified: Secondary | ICD-10-CM | POA: Insufficient documentation

## 2022-07-01 DIAGNOSIS — R197 Diarrhea, unspecified: Secondary | ICD-10-CM | POA: Insufficient documentation

## 2022-07-01 DIAGNOSIS — L853 Xerosis cutis: Secondary | ICD-10-CM | POA: Insufficient documentation

## 2022-07-01 DIAGNOSIS — Z87440 Personal history of urinary (tract) infections: Secondary | ICD-10-CM | POA: Insufficient documentation

## 2022-07-01 LAB — POCT URINALYSIS DIP (MANUAL ENTRY)
Bilirubin, UA: NEGATIVE
Glucose, UA: NEGATIVE mg/dL
Nitrite, UA: NEGATIVE
Protein Ur, POC: NEGATIVE mg/dL
Spec Grav, UA: 1.02 (ref 1.010–1.025)
Urobilinogen, UA: 0.2 E.U./dL
pH, UA: 5.5 (ref 5.0–8.0)

## 2022-07-01 MED ORDER — HYDROCORTISONE 1 % EX CREA: TOPICAL_CREAM | CUTANEOUS | 0 refills | Status: DC

## 2022-07-01 NOTE — ED Triage Notes (Signed)
Patient presents to Urgent Care with complaints of possible UTI. Son reports increased confusion, dysuria, or completing daily tasks x 1.5 week. Son contacted PCP who instructed them to come in to UC. Intermittent diarrhea x 1 month. Not treating symptoms with medications.

## 2022-07-01 NOTE — ED Provider Notes (Signed)
[facility]  Note:  This document was prepared using Systems analyst and may include unintentional dictation errors.  MRN: [id number] DOB: 04/03/1921  Subjective:   Rebecca Munoz is a 88 y.o. female presenting for 1.5 week history of dysuria, more confusion. Has also had 1 month history of intermittent diarrhea but none this past week. Patient is not eating well at her independent housing.  There are options to have somebody check in on her but per the patient's son they have not really looked into this.  They are actually trying to get her into assisted living and patient is starting to get on board with this.  He has concerns that she is not eating well.  There have been multiple instances in the past month in which suspicious spoiled food was found at her home.  The patient's son also suspects that she is only eating once a day.  She does have a primary care provider but follow-up is not until next week.  No current facility-administered medications for this encounter.  Current Outpatient Medications:    Apoaequorin (PREVAGEN EXTRA STRENGTH PO), Take 1 tablet by mouth daily., Disp: , Rfl:    CALCIUM PO, Take 1 tablet by mouth daily., Disp: , Rfl:    diphenhydrAMINE HCl (BENADRYL PO), Take 1 tablet by mouth at bedtime as needed., Disp: , Rfl:    Docusate Calcium (STOOL SOFTENER PO), Take 1 tablet by mouth daily as needed., Disp: , Rfl:    fluticasone (FLONASE) 50 MCG/ACT nasal spray, SHAKE LIQUID AND USE 2 SPRAYS IN EACH NOSTRIL DAILY, Disp: 48 g, Rfl: 0   loratadine (CLARITIN) 10 MG tablet, TAKE 1 TABLET BY MOUTH EVERY DAY, Disp: 90 tablet, Rfl: 0   Melatonin-Pyridoxine (MELATIN PO), Take by mouth at bedtime as needed., Disp: , Rfl:    Multiple Vitamins-Minerals (MULTIVITAMIN WITH MINERALS) tablet, Take 1 tablet by mouth daily., Disp: , Rfl:    omeprazole (PRILOSEC) 40 MG capsule, TAKE 1 CAPSULE(40 MG) BY MOUTH DAILY, Disp: 90 capsule, Rfl: 3    sucralfate (CARAFATE) 1 g tablet, TAKE 1 TABLET(1 GRAM) BY MOUTH DAILY WITH BREAKFAST, Disp: 30 tablet, Rfl: 0   TURMERIC PO, Take by mouth daily., Disp: , Rfl:    No Known Allergies  Past Medical History:  Diagnosis Date   Cancer ([place])    breast   Cognitive decline    Fracture, humerus    right   Osteoporosis    Seasonal allergies      Past Surgical History:  Procedure Laterality Date   APPENDECTOMY     BACK SURGERY     BREAST SURGERY Right    lumpectomy    EYE SURGERY     JOINT REPLACEMENT     MASTECTOMY      Family History  Problem Relation Age of Onset   Heart disease Mother    Colon cancer Father     Social History   Tobacco Use   Smoking status: Never   Smokeless tobacco: Never  Vaping Use   Vaping Use: Never used  Substance Use Topics   Alcohol use: No    Alcohol/week: 0.0 standard drinks of alcohol   Drug use: No    ROS   Objective:   Vitals: BP 137/68 (BP Location: Left Arm)   Pulse (!) 53   Temp 98.1 F (36.7 C) (Oral)   Resp 14   SpO2 96%   Physical Exam Exam conducted with a chaperone present (RT Dunning).  Constitutional:      General: She is not in acute distress.    Appearance: Normal appearance. She is well-developed. She is not ill-appearing, toxic-appearing or diaphoretic.  HENT:     Head: Normocephalic and atraumatic.     Nose: Nose normal.     Mouth/Throat:     Mouth: Mucous membranes are moist.  Eyes:     General: No scleral icterus.       Right eye: No discharge.        Left eye: No discharge.     Extraocular Movements: Extraocular movements intact.     Conjunctiva/sclera: Conjunctivae normal.  Cardiovascular:     Rate and Rhythm: Normal rate and regular rhythm.     Heart sounds: Normal heart sounds. No murmur heard.    No friction rub. No gallop.  Pulmonary:     Effort: Pulmonary effort is normal. No respiratory distress.     Breath sounds: No stridor. No wheezing, rhonchi or rales.  Chest:     Chest wall: No  tenderness.  Abdominal:     General: Bowel sounds are normal. There is no distension.     Palpations: Abdomen is soft. There is no mass.     Tenderness: There is no abdominal tenderness. There is no right CVA tenderness, left CVA tenderness, guarding or rebound.  Genitourinary:      Comments: There was retained toilet paper tissue that was removed in its entirety. Skin:    General: Skin is warm and dry.  Neurological:     General: No focal deficit present.     Mental Status: She is alert and oriented to person, place, and time.  Psychiatric:        Mood and Affect: Mood normal.        Behavior: Behavior normal.        Thought Content: Thought content normal.        Judgment: Judgment normal.     Results for orders placed or performed during the hospital encounter of 07/01/22 (from the past 24 hour(s))  POCT urinalysis dipstick     Status: Abnormal   Collection Time: 07/01/22 10:53 AM  Result Value Ref Range   Color, UA yellow yellow   Clarity, UA cloudy (A) clear   Glucose, UA negative negative mg/dL   Bilirubin, UA negative negative   Ketones, POC UA trace (5) (A) negative mg/dL   Spec Grav, UA 1.020 1.010 - 1.025   Blood, UA trace-intact (A) negative   pH, UA 5.5 5.0 - 8.0   Protein Ur, POC negative negative mg/dL   Urobilinogen, UA 0.2 0.2 or 1.0 E.U./dL   Nitrite, UA Negative Negative   Leukocytes, UA Trace (A) Negative    Assessment and Plan :   PDMP not reviewed this encounter.  1. Subacute confusional state   2. History of UTI   3. Diarrhea, unspecified type   4. Dysuria   5. Pain of female genitalia   6. Dry skin dermatitis    Urine culture and blood work test results pending.  Emphasized need to consider assisted living now.  No signs of an acute emergent problem at this time.  Patient is hemodynamically stable.  Advise maintaining that appointment with her PCP in the coming week.  Use hydrocortisone cream for the dry skin dermatitis about the anus.  Okay  to also use Aquaphor or Vaseline concurrently. Counseled patient on potential for adverse effects with medications prescribed/recommended today, ER and return-to-clinic precautions discussed, patient verbalized understanding.  Jaynee Eagles, [place] 07/01/22 1219

## 2022-07-01 NOTE — Discharge Instructions (Signed)
Please apply Aquaphor or Vaseline to the buttock and lower vaginal area twice daily long term. Use hydrocortisone over the buttock area twice daily for 1 week. We will let you know about your results as they come back. Please make a follow up appointment with the regular doctor.

## 2022-07-02 LAB — COMPREHENSIVE METABOLIC PANEL
ALT: 24 IU/L (ref 0–32)
AST: 26 IU/L (ref 0–40)
Albumin/Globulin Ratio: 2 (ref 1.2–2.2)
Albumin: 4.3 g/dL (ref 3.6–4.6)
Alkaline Phosphatase: 119 IU/L (ref 44–121)
BUN/Creatinine Ratio: 15 (ref 12–28)
BUN: 10 mg/dL (ref 10–36)
Bilirubin Total: 0.3 mg/dL (ref 0.0–1.2)
CO2: 27 mmol/L (ref 20–29)
Calcium: 9.3 mg/dL (ref 8.7–10.3)
Chloride: 104 mmol/L (ref 96–106)
Creatinine, Ser: 0.67 mg/dL (ref 0.57–1.00)
Globulin, Total: 2.2 g/dL (ref 1.5–4.5)
Glucose: 124 mg/dL — ABNORMAL HIGH (ref 70–99)
Potassium: 3.4 mmol/L — ABNORMAL LOW (ref 3.5–5.2)
Sodium: 143 mmol/L (ref 134–144)
Total Protein: 6.5 g/dL (ref 6.0–8.5)
eGFR: 78 mL/min/{1.73_m2} (ref >59)

## 2022-07-02 LAB — CBC
Hematocrit: 38 % (ref 34.0–46.6)
Hemoglobin: 12.3 g/dL (ref 11.1–15.9)
MCH: 30.7 pg (ref 26.6–33.0)
MCHC: 32.4 g/dL (ref 31.5–35.7)
MCV: 95 fL (ref 79–97)
Platelets: 198 10*3/uL (ref 150–450)
RBC: 4.01 x10E6/uL (ref 3.77–5.28)
RDW: 12.8 % (ref 11.7–15.4)
WBC: 5.5 10*3/uL (ref 3.4–10.8)

## 2022-07-02 LAB — URINE CULTURE

## 2022-07-11 ENCOUNTER — Emergency Department (HOSPITAL_COMMUNITY): Payer: Medicare Other

## 2022-07-11 ENCOUNTER — Encounter (HOSPITAL_COMMUNITY): Payer: Self-pay | Admitting: Emergency Medicine

## 2022-07-11 ENCOUNTER — Emergency Department (HOSPITAL_COMMUNITY)
Admission: EM | Admit: 2022-07-11 | Discharge: 2022-07-11 | Disposition: A | Discharge status: Home / Self Care | Payer: Medicare Other | Attending: Emergency Medicine | Admitting: Emergency Medicine

## 2022-07-11 DIAGNOSIS — R918 Other nonspecific abnormal finding of lung field: Secondary | ICD-10-CM

## 2022-07-11 DIAGNOSIS — F0390 Unspecified dementia without behavioral disturbance: Secondary | ICD-10-CM | POA: Diagnosis not present

## 2022-07-11 DIAGNOSIS — Z7982 Long term (current) use of aspirin: Secondary | ICD-10-CM | POA: Diagnosis not present

## 2022-07-11 DIAGNOSIS — E876 Hypokalemia: Secondary | ICD-10-CM | POA: Diagnosis not present

## 2022-07-11 DIAGNOSIS — R911 Solitary pulmonary nodule: Secondary | ICD-10-CM | POA: Diagnosis not present

## 2022-07-11 DIAGNOSIS — R079 Chest pain, unspecified: Secondary | ICD-10-CM | POA: Insufficient documentation

## 2022-07-11 LAB — CBC
HCT: 36.4 % (ref 36.0–46.0)
Hemoglobin: 12 g/dL (ref 12.0–15.0)
MCH: 30.9 pg (ref 26.0–34.0)
MCHC: 33 g/dL (ref 30.0–36.0)
MCV: 93.8 fL (ref 80.0–100.0)
Platelets: 186 10*3/uL (ref 150–400)
RBC: 3.88 MIL/uL (ref 3.87–5.11)
RDW: 13.5 % (ref 11.5–15.5)
WBC: 5 10*3/uL (ref 4.0–10.5)
nRBC: 0 % (ref 0.0–0.2)

## 2022-07-11 LAB — BASIC METABOLIC PANEL
Anion gap: 8 (ref 5–15)
BUN: 11 mg/dL (ref 8–23)
CO2: 28 mmol/L (ref 22–32)
Calcium: 8.9 mg/dL (ref 8.9–10.3)
Chloride: 105 mmol/L (ref 98–111)
Creatinine, Ser: 0.7 mg/dL (ref 0.44–1.00)
GFR, Estimated: >60 mL/min (ref >60)
Glucose, Bld: 99 mg/dL (ref 70–99)
Potassium: 3.1 mmol/L — ABNORMAL LOW (ref 3.5–5.1)
Sodium: 141 mmol/L (ref 135–145)

## 2022-07-11 LAB — TROPONIN I (HIGH SENSITIVITY)
Troponin I (High Sensitivity): 11 ng/L (ref <18)
Troponin I (High Sensitivity): 8 ng/L (ref <18)

## 2022-07-11 MED ORDER — POTASSIUM CHLORIDE CRYS ER 20 MEQ PO TBCR: 20.0000 meq | EXTENDED_RELEASE_TABLET | Freq: Two times a day (BID) | ORAL | 0 refills | Status: DC

## 2022-07-11 MED ORDER — POTASSIUM CHLORIDE CRYS ER 20 MEQ PO TBCR
40.0000 meq | EXTENDED_RELEASE_TABLET | Freq: Once | ORAL | Status: AC
  Administered 2022-07-11: 40 meq via ORAL
  Filled 2022-07-11: qty 2

## 2022-07-11 NOTE — Discharge Instructions (Signed)
Work-up for any significant cardiac event without any acute findings.  Chest x-ray shows multiple pulmonary nodules which will require recheck in 6 months to see if there is any change in size.  Potassium a little low here today he got some oral potassium here we will have you take some oral potassium at home.  Follow-up with your primary care doctor to have potassium rechecked

## 2022-07-11 NOTE — ED Triage Notes (Signed)
Patient BIB [facility] from [facility] after patient used the call light in her apartment to call her caregiver to complaint about chest pain. Patient given '324mg'$  ASA from EMS, 20g saline lock in left AC. EMS reports patient appeared confused in her home, patient did not recognize EMS and tried to refuse transport to the hospital but was transported after EMS contact her power of attorney (son, Mekaylah Klich).  EMS Vitals BP 150/100 HR 40-100 CBG 113 SpO2 97% on room air

## 2022-07-11 NOTE — ED Provider Notes (Signed)
[facility] EMERGENCY DEPARTMENT Provider Note   CSN: [id number] Arrival date & time: 07/11/22  1018     History  Chief Complaint  Patient presents with   Chest Pain    Rebecca Munoz is a 88 y.o. female.  Patient brought in by EMS from [facility].  Patient pulled her her long-arm cord.  EMS came to the house.  Patient was complaining of chest pain.  They gave her 324 mg of aspirin patient appeared confused she has a history of some dementia her son is power of attorney.  Patient is a DNR.  Past medical history is significant for breast cancer past surgical history significant for mastectomy and appendectomy patient never used tobacco products.  Patient seen recently by urgent care based on chart review for some irritation around her perianal area and she is using Aquaphor.  Patient states her confusion is baseline.       Home Medications Prior to Admission medications   Medication Sig Start Date End Date Taking? Authorizing Provider  potassium chloride SA (KLOR-CON M) 20 MEQ tablet Take 1 tablet (20 mEq total) by mouth 2 (two) times daily. 07/11/22  Yes Fredia Sorrow, MD  Apoaequorin [facility] EXTRA STRENGTH PO) Take 1 tablet by mouth daily.    [provider]  CALCIUM PO Take 1 tablet by mouth daily.    [provider]  diphenhydrAMINE HCl (BENADRYL PO) Take 1 tablet by mouth at bedtime as needed.    [provider]  Docusate Calcium (STOOL SOFTENER PO) Take 1 tablet by mouth daily as needed.    [provider]  fluticasone (FLONASE) 50 MCG/ACT nasal spray SHAKE LIQUID AND USE 2 SPRAYS IN [facility] NOSTRIL DAILY 01/05/21   Maximiano Coss, NP  hydrocortisone cream 1 % Apply to affected area 2 times daily 07/01/22   Jaynee Eagles, PA-C  loratadine (CLARITIN) 10 MG tablet TAKE 1 TABLET BY MOUTH EVERY DAY 01/18/18   Tenna Delaine D, PA-C  Melatonin-Pyridoxine (MELATIN PO) Take by mouth at bedtime as needed.    [provider]  Multiple Vitamins-Minerals (MULTIVITAMIN WITH MINERALS) tablet Take 1 tablet by mouth daily.    [provider]  omeprazole (PRILOSEC) 40 MG capsule TAKE 1 CAPSULE(40 MG) BY MOUTH DAILY 10/28/21   Maximiano Coss, NP  sucralfate (CARAFATE) 1 g tablet TAKE 1 TABLET(1 GRAM) BY MOUTH DAILY WITH BREAKFAST 11/18/20   Maximiano Coss, NP  TURMERIC PO Take by mouth daily.    [provider]      Allergies    Patient has no known allergies.    Review of Systems   Review of Systems  Constitutional:  Negative for chills and fever.  HENT:  Negative for ear pain and sore throat.   Eyes:  Negative for pain and visual disturbance.  Respiratory:  Negative for cough and shortness of breath.   Cardiovascular:  Positive for chest pain. Negative for palpitations.  Gastrointestinal:  Negative for abdominal pain and vomiting.  Genitourinary:  Negative for dysuria and hematuria.  Musculoskeletal:  Negative for arthralgias and back pain.  Skin:  Negative for color change and rash.  Neurological:  Negative for seizures and syncope.  Psychiatric/Behavioral:  Positive for confusion.   All other systems reviewed and are negative.   Physical Exam Updated Vital Signs BP (!) 195/74   Pulse 74   Temp 98.1 F (36.7 C) (Oral)   Resp 18   SpO2 97%  Physical Exam Vitals and nursing note reviewed.  Constitutional:      General: She is not in acute distress.    Appearance: She is well-developed.  HENT:     Head: Normocephalic and atraumatic.  Eyes:     Conjunctiva/sclera: Conjunctivae normal.  Cardiovascular:     Rate and Rhythm: Normal rate. Rhythm irregular.     Heart sounds: No murmur heard. Pulmonary:     Effort: Pulmonary effort is normal. No respiratory distress.     Breath sounds: Normal breath sounds. No decreased breath sounds, wheezing or rhonchi.  Chest:     Chest wall: No tenderness.  Abdominal:     Palpations: Abdomen is soft.     Tenderness: There is no  abdominal tenderness.  Musculoskeletal:        General: No swelling.     Cervical back: Neck supple.     Right lower leg: No edema.     Left lower leg: No edema.  Skin:    General: Skin is warm and dry.     Capillary Refill: Capillary refill takes less than 2 seconds.  Neurological:     General: No focal deficit present.     Mental Status: She is alert. She is disoriented.     Comments: Mental status at baseline according to son  Psychiatric:        Mood and Affect: Mood normal.     ED Results / Procedures / Treatments   Labs (all labs ordered are listed, but only abnormal results are displayed) Labs Reviewed  BASIC METABOLIC PANEL - Abnormal; Notable for the following components:      Result Value   Potassium 3.1 (*)    All other components within normal limits  CBC  TROPONIN I (HIGH SENSITIVITY)  TROPONIN I (HIGH SENSITIVITY)    EKG EKG Interpretation  Date/Time:  Sunday July 11 2022 10:34:51 EDT Ventricular Rate:  79 PR Interval:  182 QRS Duration: 82 QT Interval:  456 QTC Calculation: 448 R Axis:   72 Text Interpretation: Sinus rhythm Supraventricular bigeminy No significant change since last tracing Confirmed by Fredia Sorrow [phone]) on 07/11/2022 11:55:14 AM  Radiology CT Chest Wo Contrast  Result Date: 07/11/2022 CLINICAL DATA:  Abnormal chest radiograph EXAM: CT CHEST WITHOUT CONTRAST TECHNIQUE: Multidetector CT imaging of the chest was performed following the standard protocol without IV contrast. RADIATION DOSE REDUCTION: This exam was performed according to the departmental dose-optimization program which includes automated exposure control, adjustment of the mA and/or kV according to patient size and/or use of iterative reconstruction technique. COMPARISON:  Chest radiograph 07/11/2022 FINDINGS: Cardiovascular: Coronary artery calcification and aortic atherosclerotic calcification. Mediastinum/Nodes: No axillary or supraclavicular adenopathy. No  mediastinal or hilar adenopathy. No pericardial fluid. Esophagus normal. Lungs/Pleura: Several solid nodules and elongated nodules in the LEFT upper lobe. Elongated nodule measuring 12 mm conforms to the bronchovascular tree on image 29/4. Rounded nodule in the LEFT upper lobe measures 6 mm (25/4). Branching nodule anterior LEFT upper lobe measures 9 mm on image 25/4. Focus of atelectasis in the medial lingula measuring 15 mm (image 102/4). This may correspond to plain film findings. Branching angular consolidation in the medial RIGHT middle lobe (image 102/4) has a chronic appearance. Some branching nodularity in the superior RIGHT lower lobe additionally (image 95/4) Upper Abdomen: Enlarged LEFT adrenal gland has low density consistent benign adenoma. No upper abdominal adenopathy. Musculoskeletal: No aggressive osseous lesion. Multiple levels of endplate spurring. IMPRESSION: 1. Bilateral elongated and solid nodules within the LEFT upper lobe, RIGHT middle lobe, lingula and  RIGHT lower lobe. Favor benign chronic pulmonary nodularity. As no comparison available, recommend follow-up CT in 6 months. 2. Coronary artery calcification and Aortic Atherosclerosis (ICD10-I70.0). 3. Benign LEFT adrenal adenoma. Electronically Signed   By: Suzy Bouchard M.D.   On: 07/11/2022 13:00   DG Chest 2 View  Result Date: 07/11/2022 CLINICAL DATA:  Provided history: Chest pain. EXAM: CHEST - 2 VIEW COMPARISON:  Radiographs 05/14/2018. FINDINGS: Heart size within normal limits. Aortic atherosclerosis. 2.8 cm round opacity projecting in the region of the left lung base. Elsewhere, there is no appreciable airspace consolidation. No evidence of pleural effusion or pneumothorax. No acute bony abnormality identified. Degenerative changes of the spine. IMPRESSION: 2.8 cm round opacity projecting in the region of the left lung base. This is favored to reflect an asymmetric nipple shadow. However, a pulmonary nodule cannot be excluded.  Recommend a repeat PA chest radiograph with nipple markers. If this finding does not correspond with the left-sided nipple marker on follow-up, a chest CT is recommended for further evaluation. Otherwise, no evidence of acute cardiopulmonary abnormality. Aortic Atherosclerosis (ICD10-I70.0). Electronically Signed   By: Kellie Simmering D.O.   On: 07/11/2022 11:02    Procedures Procedures    Medications Ordered in ED Medications  potassium chloride SA (KLOR-CON M) CR tablet 40 mEq (40 mEq Oral Given 07/11/22 1257)    ED Course/ Medical Decision Making/ A&P                           Medical Decision Making Amount and/or Complexity of Data Reviewed Labs: ordered. Radiology: ordered.  Risk Prescription drug management.   Cardiac work-up here troponins x2 without significant change.  Initial troponin was 11 repeat was 8 potassium a little low at 3.1 chest x-ray raise concerns for pulmonary mass so CT chest was done which shows multiple enlarged and elongated solid nodules in the left upper lobe right middle lobe and lingular Arrien and right lower lobe.  Most likely benign.  But recommend follow-up in 6 months.  Patient's son made aware of this.  EKG shows bigeminy which patient has had in the past.  Nontoxic no acute distress.  Patient baseline for her level of confusion.  Patient is a DNR.  Patient given 40 mill equivalents of potassium p.o. for the potassium of 3.1.  We will have her take some potassium at home as well.  Patient stable for discharge home.   Final Clinical Impression(s) / ED Diagnoses Final diagnoses:  Nonspecific chest pain  Hypokalemia  Pulmonary nodules    Rx / DC Orders ED Discharge Orders          Ordered    potassium chloride SA (KLOR-CON M) 20 MEQ tablet  2 times daily        07/11/22 1337              Fredia Sorrow, MD 07/11/22 1358

## 2022-07-11 NOTE — ED Notes (Signed)
Son of patient seen pressing buttons on patients monitor. Son asked to not touch settings and that staff will look at parameter changes as needed. Son also educated that the alarms alert staff to possible patient decline and if he silences the alarms before staff hears or sees them it could have negative effects on the patient.

## 2022-07-11 NOTE — ED Notes (Signed)
Patient transported to CT 

## 2022-07-23 ENCOUNTER — Other Ambulatory Visit: Payer: Self-pay

## 2022-07-23 ENCOUNTER — Emergency Department (HOSPITAL_COMMUNITY): Payer: Medicare Other

## 2022-07-23 ENCOUNTER — Emergency Department (HOSPITAL_COMMUNITY)
Admission: EM | Admit: 2022-07-23 | Discharge: 2022-07-24 | Disposition: A | Discharge status: Skilled Nursing Facility | Payer: Medicare Other | Attending: Emergency Medicine | Admitting: Emergency Medicine

## 2022-07-23 DIAGNOSIS — W01198A Fall on same level from slipping, tripping and stumbling with subsequent striking against other object, initial encounter: Secondary | ICD-10-CM | POA: Diagnosis not present

## 2022-07-23 DIAGNOSIS — R739 Hyperglycemia, unspecified: Secondary | ICD-10-CM | POA: Insufficient documentation

## 2022-07-23 DIAGNOSIS — S0993XA Unspecified injury of face, initial encounter: Secondary | ICD-10-CM | POA: Diagnosis present

## 2022-07-23 DIAGNOSIS — F0390 Unspecified dementia without behavioral disturbance: Secondary | ICD-10-CM | POA: Insufficient documentation

## 2022-07-23 DIAGNOSIS — Z853 Personal history of malignant neoplasm of breast: Secondary | ICD-10-CM | POA: Insufficient documentation

## 2022-07-23 DIAGNOSIS — S0081XA Abrasion of other part of head, initial encounter: Secondary | ICD-10-CM | POA: Diagnosis not present

## 2022-07-23 DIAGNOSIS — W19XXXA Unspecified fall, initial encounter: Secondary | ICD-10-CM

## 2022-07-23 LAB — CBC WITH DIFFERENTIAL/PLATELET
Abs Immature Granulocytes: 0.04 10*3/uL (ref 0.00–0.07)
Basophils Absolute: 0 10*3/uL (ref 0.0–0.1)
Basophils Relative: 1 %
Eosinophils Absolute: 0.1 10*3/uL (ref 0.0–0.5)
Eosinophils Relative: 1 %
HCT: 45 % (ref 36.0–46.0)
Hemoglobin: 14.5 g/dL (ref 12.0–15.0)
Immature Granulocytes: 1 %
Lymphocytes Relative: 12 %
Lymphs Abs: 0.9 10*3/uL (ref 0.7–4.0)
MCH: 30.8 pg (ref 26.0–34.0)
MCHC: 32.2 g/dL (ref 30.0–36.0)
MCV: 95.5 fL (ref 80.0–100.0)
Monocytes Absolute: 0.6 10*3/uL (ref 0.1–1.0)
Monocytes Relative: 7 %
Neutro Abs: 6.3 10*3/uL (ref 1.7–7.7)
Neutrophils Relative %: 78 %
Platelets: 169 10*3/uL (ref 150–400)
RBC: 4.71 MIL/uL (ref 3.87–5.11)
RDW: 13.5 % (ref 11.5–15.5)
WBC: 7.9 10*3/uL (ref 4.0–10.5)
nRBC: 0 % (ref 0.0–0.2)

## 2022-07-23 LAB — COMPREHENSIVE METABOLIC PANEL
ALT: 34 U/L (ref 0–44)
AST: 52 U/L — ABNORMAL HIGH (ref 15–41)
Albumin: 4.6 g/dL (ref 3.5–5.0)
Alkaline Phosphatase: 95 U/L (ref 38–126)
Anion gap: 9 (ref 5–15)
BUN: 28 mg/dL — ABNORMAL HIGH (ref 8–23)
CO2: 25 mmol/L (ref 22–32)
Calcium: 9.6 mg/dL (ref 8.9–10.3)
Chloride: 107 mmol/L (ref 98–111)
Creatinine, Ser: 0.69 mg/dL (ref 0.44–1.00)
GFR, Estimated: >60 mL/min (ref >60)
Glucose, Bld: 101 mg/dL — ABNORMAL HIGH (ref 70–99)
Potassium: 3.6 mmol/L (ref 3.5–5.1)
Sodium: 141 mmol/L (ref 135–145)
Total Bilirubin: 1.3 mg/dL — ABNORMAL HIGH (ref 0.3–1.2)
Total Protein: 8.2 g/dL — ABNORMAL HIGH (ref 6.5–8.1)

## 2022-07-23 LAB — URINALYSIS, ROUTINE W REFLEX MICROSCOPIC
Bilirubin Urine: NEGATIVE
Glucose, UA: NEGATIVE mg/dL
Hgb urine dipstick: NEGATIVE
Ketones, ur: 20 mg/dL — AB
Leukocytes,Ua: NEGATIVE
Nitrite: NEGATIVE
Protein, ur: NEGATIVE mg/dL
Specific Gravity, Urine: 1.017 (ref 1.005–1.030)
pH: 6 (ref 5.0–8.0)

## 2022-07-23 LAB — CBG MONITORING, ED: Glucose-Capillary: 107 mg/dL — ABNORMAL HIGH (ref 70–99)

## 2022-07-23 MED ORDER — SODIUM CHLORIDE 0.9 % IV BOLUS
500.0000 mL | Freq: Once | INTRAVENOUS | Status: AC
  Administered 2022-07-23: 500 mL via INTRAVENOUS

## 2022-07-23 MED ORDER — ZIPRASIDONE MESYLATE 20 MG IM SOLR
10.0000 mg | Freq: Once | INTRAMUSCULAR | Status: AC
  Administered 2022-07-23: 10 mg via INTRAMUSCULAR
  Filled 2022-07-23: qty 20

## 2022-07-23 MED ORDER — LORAZEPAM 2 MG/ML IJ SOLN
0.5000 mg | Freq: Once | INTRAMUSCULAR | Status: AC
  Administered 2022-07-23: 0.5 mg via INTRAMUSCULAR

## 2022-07-23 MED ORDER — ACETAMINOPHEN 325 MG PO TABS: 325.0000 mg | ORAL_TABLET | ORAL | 0 refills | Status: DC | PRN

## 2022-07-23 MED ORDER — LORAZEPAM 2 MG/ML IJ SOLN
0.5000 mg | Freq: Once | INTRAMUSCULAR | Status: DC
  Filled 2022-07-23: qty 1

## 2022-07-23 NOTE — ED Provider Notes (Signed)
[facility] DEPT Provider Note   CSN: [id number] Arrival date & time: 07/23/22  1559     History  Chief Complaint  Patient presents with   Rebecca Munoz is a 88 y.o. female.  Pt is a 88 yo female with a pmhx significant for dementia, breast cancer, seasonal allergies.  Family said she has had a cognitive decline over the past 6 weeks.  She fell yesterday and today.  She fell against the wall and a picture fell on her forehead.  She is not on blood thinners.  Family said she was just speaking jibberish today and seemed to have a facial droop.  She is unable to contribute to the hx.  She lives in independent living, but has an aid 12 hrs per day.  She is scheduled to go to memory care on Monday, 10/16.       Home Medications Prior to Admission medications   Medication Sig Start Date End Date Taking? Authorizing Provider  acetaminophen (TYLENOL) 325 MG tablet Take 1 tablet (325 mg total) by mouth every 4 (four) hours as needed for mild pain. 07/23/22  Yes Isla Pence, MD  Calcium Carb-Cholecalciferol (CALCIUM + D3 PO) Take 1 tablet by mouth daily with breakfast.   Yes [provider]  Multiple Vitamins-Minerals (ONE-A-DAY WOMENS 50+) TABS Take 1 tablet by mouth daily with breakfast.   Yes [provider]  omeprazole (PRILOSEC) 40 MG capsule TAKE 1 CAPSULE(40 MG) BY MOUTH DAILY Patient taking differently: Take 40 mg by mouth daily before breakfast. 10/28/21  Yes Maximiano Coss, NP  fluticasone (FLONASE) 50 MCG/ACT nasal spray SHAKE LIQUID AND USE 2 SPRAYS IN [facility] NOSTRIL DAILY Patient not taking: Reported on 07/23/2022 01/05/21   Maximiano Coss, NP  hydrocortisone cream 1 % Apply to affected area 2 times daily Patient not taking: Reported on 07/23/2022 07/01/22   Jaynee Eagles, PA-C  loratadine (CLARITIN) 10 MG tablet TAKE 1 TABLET BY MOUTH EVERY DAY Patient not taking: Reported on 07/23/2022 01/18/18   Tenna Delaine D,  PA-C  potassium chloride SA (KLOR-CON M) 20 MEQ tablet Take 1 tablet (20 mEq total) by mouth 2 (two) times daily. Patient not taking: Reported on 07/23/2022 07/11/22   Fredia Sorrow, MD  sucralfate (CARAFATE) 1 g tablet TAKE 1 TABLET(1 GRAM) BY MOUTH DAILY WITH BREAKFAST Patient not taking: Reported on 07/23/2022 11/18/20   Maximiano Coss, NP      Allergies    Tape    Review of Systems   Review of Systems  Unable to perform ROS: Dementia    Physical Exam Updated Vital Signs BP (!) 174/46   Pulse 79   Temp (!) 97.4 F (36.3 C) (Oral)   Resp 18   SpO2 99%  Physical Exam Vitals and nursing note reviewed.  HENT:     Head: Normocephalic.     Comments: Small abrasion to forehead    Right Ear: External ear normal.     Left Ear: External ear normal.     Nose: Nose normal.     Mouth/Throat:     Mouth: Mucous membranes are dry.  Eyes:     Extraocular Movements: Extraocular movements intact.     Conjunctiva/sclera: Conjunctivae normal.     Pupils: Pupils are equal, round, and reactive to light.  Neck:     Comments: In c-collar Cardiovascular:     Rate and Rhythm: Normal rate and regular rhythm.     Pulses: Normal pulses.  Heart sounds: Normal heart sounds.  Pulmonary:     Effort: Pulmonary effort is normal.     Breath sounds: Normal breath sounds.  Abdominal:     General: Abdomen is flat. Bowel sounds are normal.     Palpations: Abdomen is soft.  Musculoskeletal:        General: Normal range of motion.  Skin:    General: Skin is warm.     Capillary Refill: Capillary refill takes less than 2 seconds.  Neurological:     Mental Status: She is alert. She is disoriented and confused.     Comments: Pt is moving all extremities.  She is not following commands.  Psychiatric:        Behavior: Behavior is agitated.     ED Results / Procedures / Treatments   Labs (all labs ordered are listed, but only abnormal results are displayed) Labs Reviewed  COMPREHENSIVE  METABOLIC PANEL - Abnormal; Notable for the following components:      Result Value   Glucose, Bld 101 (*)    BUN 28 (*)    Total Protein 8.2 (*)    AST 52 (*)    Total Bilirubin 1.3 (*)    All other components within normal limits  URINALYSIS, ROUTINE W REFLEX MICROSCOPIC - Abnormal; Notable for the following components:   Ketones, ur 20 (*)    All other components within normal limits  CBG MONITORING, ED - Abnormal; Notable for the following components:   Glucose-Capillary 107 (*)    All other components within normal limits  CBC WITH DIFFERENTIAL/PLATELET    EKG EKG Interpretation  Date/Time:  Friday July 23 2022 17:20:45 EDT Ventricular Rate:  106 PR Interval:  181 QRS Duration: 80 QT Interval:  353 QTC Calculation: 469 R Axis:   80 Text Interpretation: Sinus tachycardia with irregular rate Minimal ST depression, diffuse leads Since last tracing rate faster Confirmed by Isla Pence [phone]) on 07/23/2022 5:38:47 PM  Radiology CT Head Wo Contrast  Result Date: 07/23/2022 CLINICAL DATA:  Multiple falls EXAM: CT HEAD WITHOUT CONTRAST CT CERVICAL SPINE WITHOUT CONTRAST TECHNIQUE: Multidetector CT imaging of the head and cervical spine was performed following the standard protocol without intravenous contrast. Multiplanar CT image reconstructions of the cervical spine were also generated. RADIATION DOSE REDUCTION: This exam was performed according to the departmental dose-optimization program which includes automated exposure control, adjustment of the mA and/or kV according to patient size and/or use of iterative reconstruction technique. COMPARISON:  None Available. FINDINGS: CT HEAD FINDINGS Brain: No evidence of acute infarct, hemorrhage, mass, mass effect, or midline shift. No hydrocephalus or extra-axial fluid collection. Age related cerebral atrophy. Periventricular white matter changes, likely the sequela of chronic small vessel ischemic disease. Vascular: No hyperdense  vessel. Atherosclerotic calcifications in the intracranial carotid and vertebral arteries. Skull: Normal. Negative for fracture or focal lesion. Sinuses/Orbits: Mucous retention cysts in the maxillary sinuses. Status post bilateral lens replacements. Other: The mastoid air cells are well aerated. CT CERVICAL SPINE FINDINGS Alignment: No listhesis. Mild exaggeration of the normal cervical lordosis. Skull base and vertebrae: No acute fracture. No primary bone lesion or focal pathologic process. Soft tissues and spinal canal: No prevertebral fluid or swelling. No visible canal hematoma. Disc levels: Degenerative changes, most prominently at C5-C6, where there is a small disc osteophyte complex. Upper chest: No focal pulmonary opacity or pleural effusion. Apical pleural-parenchymal scarring. Other: Small hypoenhancing nodules in the thyroid (< 1.5 cm), for which no follow-up is indicated. (Reference: J  Am Coll Radiol. 2015 Feb;12(2): 143-50) IMPRESSION: 1. No acute intracranial process. 2. No acute fracture or traumatic listhesis in the cervical spine. Electronically Signed   By: Merilyn Baba M.D.   On: 07/23/2022 19:29   CT Cervical Spine Wo Contrast  Result Date: 07/23/2022 CLINICAL DATA:  Multiple falls EXAM: CT HEAD WITHOUT CONTRAST CT CERVICAL SPINE WITHOUT CONTRAST TECHNIQUE: Multidetector CT imaging of the head and cervical spine was performed following the standard protocol without intravenous contrast. Multiplanar CT image reconstructions of the cervical spine were also generated. RADIATION DOSE REDUCTION: This exam was performed according to the departmental dose-optimization program which includes automated exposure control, adjustment of the mA and/or kV according to patient size and/or use of iterative reconstruction technique. COMPARISON:  None Available. FINDINGS: CT HEAD FINDINGS Brain: No evidence of acute infarct, hemorrhage, mass, mass effect, or midline shift. No hydrocephalus or extra-axial  fluid collection. Age related cerebral atrophy. Periventricular white matter changes, likely the sequela of chronic small vessel ischemic disease. Vascular: No hyperdense vessel. Atherosclerotic calcifications in the intracranial carotid and vertebral arteries. Skull: Normal. Negative for fracture or focal lesion. Sinuses/Orbits: Mucous retention cysts in the maxillary sinuses. Status post bilateral lens replacements. Other: The mastoid air cells are well aerated. CT CERVICAL SPINE FINDINGS Alignment: No listhesis. Mild exaggeration of the normal cervical lordosis. Skull base and vertebrae: No acute fracture. No primary bone lesion or focal pathologic process. Soft tissues and spinal canal: No prevertebral fluid or swelling. No visible canal hematoma. Disc levels: Degenerative changes, most prominently at C5-C6, where there is a small disc osteophyte complex. Upper chest: No focal pulmonary opacity or pleural effusion. Apical pleural-parenchymal scarring. Other: Small hypoenhancing nodules in the thyroid (< 1.5 cm), for which no follow-up is indicated. (Reference: J Am Coll Radiol. 2015 Feb;12(2): 143-50) IMPRESSION: 1. No acute intracranial process. 2. No acute fracture or traumatic listhesis in the cervical spine. Electronically Signed   By: Merilyn Baba M.D.   On: 07/23/2022 19:29   DG Pelvis 1-2 Views  Result Date: 07/23/2022 CLINICAL DATA:  Fall EXAM: PELVIS - 1-2 VIEW COMPARISON:  06/18/2017 CT pelvis FINDINGS: Status post intramedullary nail and screw fixation of a right intertrochanteric femur fracture. No evidence of hardware failure. No acute fracture in the pelvis or bilateral hips. Evaluation of the sacrum is limited by overlying bowel gas. Degenerative changes in the hips and lower lumbar spine. IMPRESSION: No acute osseous abnormality. Electronically Signed   By: Merilyn Baba M.D.   On: 07/23/2022 19:25   DG Chest 1 View  Result Date: 07/23/2022 CLINICAL DATA:  [place] yesterday and today.  EXAM: CHEST  1 VIEW COMPARISON:  Chest radiographs 07/11/2022; CT chest 07/11/2022 FINDINGS: Cardiac silhouette and mediastinal contours are within normal limits. Moderate calcifications within the aortic arch. Mild chronic interstitial thickening. Chronic calcified likely granuloma overlying the right lung base, unchanged and benign. No focal airspace opacity is seen to indicate pneumonia. Mild-to-moderate levocurvature centered within the midthoracic spine. Moderate multilevel degenerative disc and endplate changes. IMPRESSION: 1. No acute cardiopulmonary disease process. 2. Mild chronic interstitial thickening. Electronically Signed   By: Yvonne Kendall M.D.   On: 07/23/2022 19:22    Procedures Procedures    Medications Ordered in ED Medications  LORazepam (ATIVAN) injection 0.5 mg (0.5 mg Intramuscular Given 07/23/22 1728)  ziprasidone (GEODON) injection 10 mg (10 mg Intramuscular Given 07/23/22 1823)  sodium chloride 0.9 % bolus 500 mL (0 mLs Intravenous Stopped 07/23/22 2223)    ED Course/ Medical Decision  Making/ A&P                           Medical Decision Making Amount and/or Complexity of Data Reviewed Labs: ordered. Radiology: ordered.  Risk OTC drugs. Prescription drug management.   This patient presents to the ED for concern of multiple falls and ams, this involves an extensive number of treatment options, and is a complaint that carries with it a high risk of complications and morbidity.  The differential diagnosis includes infection, head injury, electrolyte abn, dementia   Co morbidities that complicate the patient evaluation  dementia, breast cancer, seasonal allergies   Additional history obtained:  Additional history obtained from epic chart review External records from outside source obtained and reviewed including family/EMS report   Lab Tests:  I Ordered, and personally interpreted labs.  The pertinent results include:  ua nl, cmp nl, cbc  nl   Imaging Studies ordered:  I ordered imaging studies including ct head/c-spine/CXR/pelvis I independently visualized and interpreted imaging which showed  CT head/c-spine: IMPRESSION:  1. No acute intracranial process.  2. No acute fracture or traumatic listhesis in the cervical spine.  Pelvis: IMPRESSION:  No acute osseous abnormality.  CXR: IMPRESSION:  1. No acute cardiopulmonary disease process.  2. Mild chronic interstitial thickening.   I agree with the radiologist interpretation   Cardiac Monitoring:  The patient was maintained on a cardiac monitor.  I personally viewed and interpreted the cardiac monitored which showed an underlying rhythm of: nsr   Medicines ordered and prescription drug management:  I ordered medication including ativan and geodon  for agitation  Reevaluation of the patient after these medicines showed that the patient improved I have reviewed the patients home medicines and have made adjustments as needed   Test Considered:  ct   Critical Interventions:  ivfs  Problem List / ED Course:  Multiple falls:  likely due to dementia and poor vision.  Family will arrange for 24 hr care until she gets in the memory unit.   Reevaluation:  After the interventions noted above, I reevaluated the patient and found that they have :improved   Social Determinants of Health:  Lives in independent living.  She will go to memory care on Monday.  In the meantime, pt will have 24 hr caregivers.   Dispostion:  After consideration of the diagnostic results and the patients response to treatment, I feel that the patent would benefit from discharge with outpatient f/u.          Final Clinical Impression(s) / ED Diagnoses Final diagnoses:  Fall, initial encounter  Abrasion of face, initial encounter    Rx / DC Orders ED Discharge Orders          Ordered    acetaminophen (TYLENOL) 325 MG tablet  Every 4 hours PRN        07/23/22 2338               Isla Pence, MD 07/23/22 2346

## 2022-07-23 NOTE — ED Notes (Signed)
  [facility] called for transport back to [facility].

## 2022-07-23 NOTE — ED Triage Notes (Signed)
Pt BIBA from [facility]. Pt fell yesterday and today. Both were witnessed falls. NO LOC, pt not on blood thinners. Pt does have some abrasions to head. Pt reported to seem weaker today. Normally walks with a walker.   Pt Aox1, baseline

## 2022-07-23 NOTE — ED Notes (Signed)
Pt continues to be agitated, interfering with care. Geodon administered.

## 2022-08-30 ENCOUNTER — Encounter (HOSPITAL_COMMUNITY): Payer: Self-pay | Admitting: Emergency Medicine

## 2022-08-30 ENCOUNTER — Other Ambulatory Visit: Payer: Self-pay

## 2022-08-30 ENCOUNTER — Emergency Department (HOSPITAL_COMMUNITY): Payer: Medicare Other

## 2022-08-30 ENCOUNTER — Inpatient Hospital Stay (HOSPITAL_COMMUNITY)
Admission: EM | Admit: 2022-08-30 | Discharge: 2022-09-10 | DRG: 951 | Disposition: E | Payer: Medicare Other | Source: Skilled Nursing Facility | Attending: Internal Medicine | Admitting: Internal Medicine

## 2022-08-30 DIAGNOSIS — W06XXXA Fall from bed, initial encounter: Secondary | ICD-10-CM | POA: Diagnosis present

## 2022-08-30 DIAGNOSIS — R410 Disorientation, unspecified: Secondary | ICD-10-CM | POA: Diagnosis not present

## 2022-08-30 DIAGNOSIS — S0181XA Laceration without foreign body of other part of head, initial encounter: Secondary | ICD-10-CM | POA: Diagnosis present

## 2022-08-30 DIAGNOSIS — Z515 Encounter for palliative care: Secondary | ICD-10-CM | POA: Diagnosis not present

## 2022-08-30 DIAGNOSIS — S062X0A Diffuse traumatic brain injury without loss of consciousness, initial encounter: Secondary | ICD-10-CM

## 2022-08-30 DIAGNOSIS — F0390 Unspecified dementia without behavioral disturbance: Secondary | ICD-10-CM | POA: Diagnosis present

## 2022-08-30 DIAGNOSIS — Z9109 Other allergy status, other than to drugs and biological substances: Secondary | ICD-10-CM

## 2022-08-30 DIAGNOSIS — R0609 Other forms of dyspnea: Secondary | ICD-10-CM | POA: Diagnosis not present

## 2022-08-30 DIAGNOSIS — Z8673 Personal history of transient ischemic attack (TIA), and cerebral infarction without residual deficits: Secondary | ICD-10-CM

## 2022-08-30 DIAGNOSIS — R5381 Other malaise: Secondary | ICD-10-CM | POA: Diagnosis present

## 2022-08-30 DIAGNOSIS — S0633AA Contusion and laceration of cerebrum, unspecified, with loss of consciousness status unknown, initial encounter: Secondary | ICD-10-CM | POA: Diagnosis present

## 2022-08-30 DIAGNOSIS — S066XAA Traumatic subarachnoid hemorrhage with loss of consciousness status unknown, initial encounter: Secondary | ICD-10-CM | POA: Diagnosis present

## 2022-08-30 DIAGNOSIS — Z66 Do not resuscitate: Secondary | ICD-10-CM | POA: Diagnosis present

## 2022-08-30 DIAGNOSIS — S52502A Unspecified fracture of the lower end of left radius, initial encounter for closed fracture: Secondary | ICD-10-CM | POA: Diagnosis present

## 2022-08-30 DIAGNOSIS — Z9010 Acquired absence of unspecified breast and nipple: Secondary | ICD-10-CM | POA: Diagnosis not present

## 2022-08-30 DIAGNOSIS — M810 Age-related osteoporosis without current pathological fracture: Secondary | ICD-10-CM | POA: Diagnosis present

## 2022-08-30 DIAGNOSIS — Z9049 Acquired absence of other specified parts of digestive tract: Secondary | ICD-10-CM

## 2022-08-30 DIAGNOSIS — Z853 Personal history of malignant neoplasm of breast: Secondary | ICD-10-CM | POA: Diagnosis not present

## 2022-08-30 DIAGNOSIS — J302 Other seasonal allergic rhinitis: Secondary | ICD-10-CM | POA: Diagnosis present

## 2022-08-30 DIAGNOSIS — S52612A Displaced fracture of left ulna styloid process, initial encounter for closed fracture: Secondary | ICD-10-CM | POA: Diagnosis present

## 2022-08-30 DIAGNOSIS — S065XAA Traumatic subdural hemorrhage with loss of consciousness status unknown, initial encounter: Secondary | ICD-10-CM | POA: Diagnosis present

## 2022-08-30 DIAGNOSIS — M25532 Pain in left wrist: Secondary | ICD-10-CM | POA: Diagnosis present

## 2022-08-30 DIAGNOSIS — S0636AA Traumatic hemorrhage of cerebrum, unspecified, with loss of consciousness status unknown, initial encounter: Secondary | ICD-10-CM | POA: Diagnosis not present

## 2022-08-30 DIAGNOSIS — Y92122 Bedroom in nursing home as the place of occurrence of the external cause: Secondary | ICD-10-CM | POA: Diagnosis not present

## 2022-08-30 DIAGNOSIS — S06319A Contusion and laceration of right cerebrum with loss of consciousness of unspecified duration, initial encounter: Secondary | ICD-10-CM | POA: Diagnosis not present

## 2022-08-30 DIAGNOSIS — Z79899 Other long term (current) drug therapy: Secondary | ICD-10-CM | POA: Diagnosis not present

## 2022-08-30 DIAGNOSIS — W19XXXA Unspecified fall, initial encounter: Secondary | ICD-10-CM | POA: Diagnosis not present

## 2022-08-30 DIAGNOSIS — R41841 Cognitive communication deficit: Secondary | ICD-10-CM | POA: Diagnosis not present

## 2022-08-30 DIAGNOSIS — S06319D Contusion and laceration of right cerebrum with loss of consciousness of unspecified duration, subsequent encounter: Secondary | ICD-10-CM | POA: Diagnosis not present

## 2022-08-30 DIAGNOSIS — R54 Age-related physical debility: Secondary | ICD-10-CM | POA: Diagnosis not present

## 2022-08-30 MED ORDER — ONDANSETRON HCL 4 MG/2ML IJ SOLN
4.0000 mg | Freq: Four times a day (QID) | INTRAMUSCULAR | Status: DC | PRN
  Administered 2022-08-31: 4 mg via INTRAVENOUS
  Filled 2022-08-30: qty 2

## 2022-08-30 MED ORDER — GLYCOPYRROLATE 0.2 MG/ML IJ SOLN
0.2000 mg | INTRAMUSCULAR | Status: DC | PRN
  Administered 2022-08-31 (×2): 0.2 mg via INTRAVENOUS
  Filled 2022-08-30 (×2): qty 1

## 2022-08-30 MED ORDER — GLYCOPYRROLATE 1 MG PO TABS: 1.0000 mg | ORAL_TABLET | ORAL | Status: DC | PRN

## 2022-08-30 MED ORDER — MORPHINE 100MG IN NS 100ML (1MG/ML) PREMIX INFUSION: 0.0000 mg/h | INTRAVENOUS | Status: DC

## 2022-08-30 MED ORDER — ACETAMINOPHEN 325 MG PO TABS: 650.0000 mg | ORAL_TABLET | Freq: Four times a day (QID) | ORAL | Status: DC | PRN

## 2022-08-30 MED ORDER — POLYVINYL ALCOHOL 1.4 % OP SOLN: 1.0000 [drp] | Freq: Four times a day (QID) | OPHTHALMIC | Status: DC | PRN

## 2022-08-30 MED ORDER — ACETAMINOPHEN 650 MG RE SUPP: 650.0000 mg | Freq: Four times a day (QID) | RECTAL | Status: DC | PRN

## 2022-08-30 MED ORDER — HALOPERIDOL 0.5 MG PO TABS: 0.5000 mg | ORAL_TABLET | ORAL | Status: DC | PRN

## 2022-08-30 MED ORDER — HALOPERIDOL LACTATE 2 MG/ML PO CONC: 0.5000 mg | ORAL | Status: DC | PRN

## 2022-08-30 MED ORDER — BIOTENE DRY MOUTH MT LIQD: 15.0000 mL | OROMUCOSAL | Status: DC | PRN

## 2022-08-30 MED ORDER — ONDANSETRON 4 MG PO TBDP: 4.0000 mg | ORAL_TABLET | Freq: Four times a day (QID) | ORAL | Status: DC | PRN

## 2022-08-30 MED ORDER — HALOPERIDOL LACTATE 5 MG/ML IJ SOLN: 0.5000 mg | INTRAMUSCULAR | Status: DC | PRN

## 2022-08-30 MED ORDER — MORPHINE BOLUS VIA INFUSION: 1.0000 mg | INTRAVENOUS | Status: DC | PRN

## 2022-08-30 MED ORDER — GLYCOPYRROLATE 0.2 MG/ML IJ SOLN: 0.2000 mg | INTRAMUSCULAR | Status: DC | PRN

## 2022-08-30 NOTE — Progress Notes (Signed)
Orthopedic Tech Progress Note Patient Details:  Rebecca Munoz 01-02-1921 [id number]  Ortho Devices Type of Ortho Device: Sugartong splint, Arm sling Ortho Device/Splint Location: lue Ortho Device/Splint Interventions: Ordered, Application, Adjustment   Post Interventions Patient Tolerated: Well Instructions Provided: Care of device, Adjustment of device  [name] 09/09/2022, 10:09 PM

## 2022-08-30 NOTE — Assessment & Plan Note (Signed)
Large bleed: also with SAH, SDH and midline shift as described in CT

## 2022-08-30 NOTE — ED Notes (Signed)
Returned from CT.

## 2022-08-30 NOTE — Assessment & Plan Note (Addendum)
Pt with large traumatic brain bleed, midline shift.  Pt DNR/DNI and comfort measures confirmed with Son at bedside.  Pt not candidate for surgical intervention per NS (nor does it sound like she / family would want this). Goals of care = patient comfort Morphine gtt ordered if needed, PRN boluses Pal care consult in AM for consideration of hospice. Very possible that she passes before then though, her exam seems to have gotten worse even since coming in a couple hours ago with worse GCS

## 2022-08-30 NOTE — ED Triage Notes (Signed)
Arrives via [facility] from [facility] for unwitnessed fall.  Arrives in c-collar, nonambulatory. Injuries include hematoma and laceration to L forehead, L wrist swelling and pain. Endorses HA.  H/o dementia, appears baseline per staff  EMS v/s: SBP150, 100bpm, 96% RA, CBG 130

## 2022-08-30 NOTE — ED Notes (Signed)
Confirmed with EDP that patient is comfort care and that no medications need to be administered to address BP

## 2022-08-30 NOTE — ED Provider Notes (Signed)
[facility] EMERGENCY DEPARTMENT Provider Note   CSN: [id number] Arrival date & time: 08/19/2022  1903     History  Chief Complaint  Patient presents with   Rebecca Munoz is a 88 y.o. female.   Fall  Patient with fall.  Reportedly fell out of bed.  Has had dementia at baseline but more confused now.  Not able to identify son.  Does not appear to move extremities.  Hematoma with laceration to left forehead.  Also has been complaining of left wrist pain.  Cervical collar in place. Patient's son states the patient is a DNR.  Would not want extraordinary measures done.    Past Medical History:  Diagnosis Date   Cancer [facility])    breast   Cognitive decline    Fracture, humerus    right   Osteoporosis    Seasonal allergies     Home Medications Prior to Admission medications   Medication Sig Start Date End Date Taking? Authorizing Provider  acetaminophen (TYLENOL) 325 MG tablet Take 1 tablet (325 mg total) by mouth every 4 (four) hours as needed for mild pain. 07/23/22   Isla Pence, MD  Calcium Carb-Cholecalciferol (CALCIUM + D3 PO) Take 1 tablet by mouth daily with breakfast.    [provider]  fluticasone (FLONASE) 50 MCG/ACT nasal spray SHAKE LIQUID AND USE 2 SPRAYS IN EACH NOSTRIL DAILY Patient not taking: Reported on 07/23/2022 01/05/21   Maximiano Coss, NP  hydrocortisone cream 1 % Apply to affected area 2 times daily Patient not taking: Reported on 07/23/2022 07/01/22   Jaynee Eagles, PA-C  loratadine (CLARITIN) 10 MG tablet TAKE 1 TABLET BY MOUTH EVERY DAY Patient not taking: Reported on 07/23/2022 01/18/18   Tenna Delaine D, PA-C  Multiple Vitamins-Minerals (ONE-A-DAY WOMENS 50+) TABS Take 1 tablet by mouth daily with breakfast.    [provider]  omeprazole (PRILOSEC) 40 MG capsule TAKE 1 CAPSULE(40 MG) BY MOUTH DAILY Patient taking differently: Take 40 mg by mouth daily before breakfast. 10/28/21   Maximiano Coss, NP   potassium chloride SA (KLOR-CON M) 20 MEQ tablet Take 1 tablet (20 mEq total) by mouth 2 (two) times daily. Patient not taking: Reported on 07/23/2022 07/11/22   Fredia Sorrow, MD  sucralfate (CARAFATE) 1 g tablet TAKE 1 TABLET(1 GRAM) BY MOUTH DAILY WITH BREAKFAST Patient not taking: Reported on 07/23/2022 11/18/20   Maximiano Coss, NP      Allergies    Tape    Review of Systems   Review of Systems  Physical Exam Updated Vital Signs BP (!) 175/107 (BP Location: Right Arm)   Pulse 81   Temp (!) 97.3 F (36.3 C) (Axillary)   Resp 15   Wt 60 kg   SpO2 97%   BMI 21.35 kg/m  Physical Exam Vitals reviewed.  HENT:     Head:     Comments: Hematoma to left temporal area.  Has approximately 2 cm laceration on it. Cardiovascular:     Rate and Rhythm: Regular rhythm.  Pulmonary:     Breath sounds: No wheezing or rhonchi.  Musculoskeletal:     Cervical back: Neck supple.     Comments: Tenderness to left wrist with some deformity.  Neurological:     Comments: Looks to son.  Unable to identify him.  Really minimally verbal.  Does say it hurts when he is removing her rings.  Potentially less movement on left side but does have pain on the wrist.  ED Results / Procedures / Treatments   Labs (all labs ordered are listed, but only abnormal results are displayed) Labs Reviewed - No data to display  EKG None  Radiology CT HEAD WO CONTRAST (5MM)  Result Date: 08/17/2022 CLINICAL DATA:  Head and neck trauma EXAM: CT HEAD WITHOUT CONTRAST CT CERVICAL SPINE WITHOUT CONTRAST TECHNIQUE: Multidetector CT imaging of the head and cervical spine was performed following the standard protocol without intravenous contrast. Multiplanar CT image reconstructions of the cervical spine were also generated. RADIATION DOSE REDUCTION: This exam was performed according to the departmental dose-optimization program which includes automated exposure control, adjustment of the mA and/or kV according to  patient size and/or use of iterative reconstruction technique. COMPARISON:  07/23/2022 FINDINGS: CT HEAD FINDINGS Brain: Intraparenchymal hemorrhage and subarachnoid hemorrhage centered in the right temporal, occipital, and parietal lobe, with additional subarachnoid hemorrhage in the right frontal lobe (series 3, image 27) and 4 mm subdural hemorrhage overlying the right cerebral convexity (series 5, image 34). The right temporal occipital parietal hematoma measures approximately 5.5 x 2.6 x 5.0 cm (AP x TR x CC) (approximately 36 mL) (series 6, image 24 and series 5, image 47). Layering within the hematoma, likely hyperacute on acute blood products. Surrounding hypodensity, likely edema, with associated mass effect and 8 mm of right-to-left midline shift. Effacement of the right lateral ventricle. Although the right temporal horn is larger than the left, this appears unchanged from 07/23/2022 Additional trace subarachnoid hemorrhage in the left frontal (series 3, image 30) and posterior temporal lobes (series 3, image 17 and series 5, image 46), as well as the right cerebellum (series 5, image 49. Periventricular white matter changes, likely the sequela of chronic small vessel ischemic disease. Vascular: No hyperdense vessel. Atherosclerotic calcifications in the intracranial carotid and vertebral arteries. Skull: No acute fracture.  Large left frontal scalp hematoma. Sinuses/Orbits: Mucosal thickening in the paranasal sinuses. Status post bilateral lens replacements. Other: The mastoids are well aerated. CT CERVICAL SPINE FINDINGS Alignment: No listhesis. Skull base and vertebrae: No acute fracture. No primary bone lesion or focal pathologic process. Soft tissues and spinal canal: No prevertebral fluid or swelling. No visible canal hematoma. Disc levels:  Degenerative changes, most prominent C5-C6. Upper chest: Small pulmonary opacities, favored to be infectious or inflammatory. Hypoenhancing nodules in the  thyroid, measuring up to 9 mm, for which no follow-up is currently indicated. (Reference: J Am Coll Radiol. 2015 Feb;12(2): 143-50) Other: None. IMPRESSION: 1. Intraparenchymal hematoma centered in the right temporal, occipital, and parietal lobe, with 8 mm of right-to-left midline shift and effacement the right lateral ventricle. 2. Subarachnoid hemorrhage associated with the hematoma as well as in the right frontal lobe, left frontal lobe, and left temporal lobe. 3. 4 mm subdural hemorrhage overlying the right cerebral convexity. 4. No acute fracture or traumatic listhesis in the cervical spine. These results were called by telephone at the time of interpretation on 08/15/2022 at 9:11 pm to provider Davonna Belling , who verbally acknowledged these results. Electronically Signed   By: Merilyn Baba M.D.   On: 08/28/2022 21:11   CT Cervical Spine Wo Contrast  Result Date: 08/23/2022 CLINICAL DATA:  Head and neck trauma EXAM: CT HEAD WITHOUT CONTRAST CT CERVICAL SPINE WITHOUT CONTRAST TECHNIQUE: Multidetector CT imaging of the head and cervical spine was performed following the standard protocol without intravenous contrast. Multiplanar CT image reconstructions of the cervical spine were also generated. RADIATION DOSE REDUCTION: This exam was performed according to the departmental dose-optimization  program which includes automated exposure control, adjustment of the mA and/or kV according to patient size and/or use of iterative reconstruction technique. COMPARISON:  07/23/2022 FINDINGS: CT HEAD FINDINGS Brain: Intraparenchymal hemorrhage and subarachnoid hemorrhage centered in the right temporal, occipital, and parietal lobe, with additional subarachnoid hemorrhage in the right frontal lobe (series 3, image 27) and 4 mm subdural hemorrhage overlying the right cerebral convexity (series 5, image 34). The right temporal occipital parietal hematoma measures approximately 5.5 x 2.6 x 5.0 cm (AP x TR x CC)  (approximately 36 mL) (series 6, image 24 and series 5, image 47). Layering within the hematoma, likely hyperacute on acute blood products. Surrounding hypodensity, likely edema, with associated mass effect and 8 mm of right-to-left midline shift. Effacement of the right lateral ventricle. Although the right temporal horn is larger than the left, this appears unchanged from 07/23/2022 Additional trace subarachnoid hemorrhage in the left frontal (series 3, image 30) and posterior temporal lobes (series 3, image 17 and series 5, image 46), as well as the right cerebellum (series 5, image 49. Periventricular white matter changes, likely the sequela of chronic small vessel ischemic disease. Vascular: No hyperdense vessel. Atherosclerotic calcifications in the intracranial carotid and vertebral arteries. Skull: No acute fracture.  Large left frontal scalp hematoma. Sinuses/Orbits: Mucosal thickening in the paranasal sinuses. Status post bilateral lens replacements. Other: The mastoids are well aerated. CT CERVICAL SPINE FINDINGS Alignment: No listhesis. Skull base and vertebrae: No acute fracture. No primary bone lesion or focal pathologic process. Soft tissues and spinal canal: No prevertebral fluid or swelling. No visible canal hematoma. Disc levels:  Degenerative changes, most prominent C5-C6. Upper chest: Small pulmonary opacities, favored to be infectious or inflammatory. Hypoenhancing nodules in the thyroid, measuring up to 9 mm, for which no follow-up is currently indicated. (Reference: J Am Coll Radiol. 2015 Feb;12(2): 143-50) Other: None. IMPRESSION: 1. Intraparenchymal hematoma centered in the right temporal, occipital, and parietal lobe, with 8 mm of right-to-left midline shift and effacement the right lateral ventricle. 2. Subarachnoid hemorrhage associated with the hematoma as well as in the right frontal lobe, left frontal lobe, and left temporal lobe. 3. 4 mm subdural hemorrhage overlying the right  cerebral convexity. 4. No acute fracture or traumatic listhesis in the cervical spine. These results were called by telephone at the time of interpretation on 09/04/2022 at 9:11 pm to provider Davonna Belling , who verbally acknowledged these results. Electronically Signed   By: Merilyn Baba M.D.   On: 08/22/2022 21:11   DG Chest Portable 1 View  Result Date: 08/15/2022 CLINICAL DATA:  Fall. Altered mental status EXAM: PORTABLE CHEST 1 VIEW COMPARISON:  07/23/2022 FINDINGS: The cardiomediastinal contours are normal. Multiple skin folds project over the right hemithorax with overlying artifact. Pulmonary vasculature is normal. No consolidation, pleural effusion, or pneumothorax. Remote healed left rib fractures no acute osseous abnormalities are seen. IMPRESSION: 1. No acute abnormality. 2. Multiple skin folds project over the right hemithorax with overlying artifact. Electronically Signed   By: Keith Rake M.D.   On: 09/05/2022 20:37   DG Wrist Complete Left  Result Date: 08/14/2022 CLINICAL DATA:  Fall. EXAM: LEFT WRIST - COMPLETE 3+ VIEW COMPARISON:  None Available. FINDINGS: Comminuted displaced distal radius fracture with apex volar angulation. Fracture extends to the distal radioulnar joint which appears disrupted. There is involvement of the distal radial carpal joint. Minimally displaced ulna styloid fracture which is partially obscured by overlapping structures. No obvious carpal bone fracture. Soft tissue edema seen  at the fracture site. IMPRESSION: 1. Comminuted displaced and angulated distal radius fracture extending to the distal radioulnar and radiocarpal joints. 2. Minimally displaced ulna styloid fracture. Electronically Signed   By: Keith Rake M.D.   On: 09/03/2022 20:36    Procedures Procedures    Medications Ordered in ED Medications - No data to display  ED Course/ Medical Decision Making/ A&P                           Medical Decision Making Amount and/or  Complexity of Data Reviewed Radiology: ordered.  Risk Decision regarding hospitalization.   Patient with fall.  Hematoma left forehead.  Mental status change.  Would not want extraordinary measures.  DNR came with patient.  Hematoma with laceration.  Left wrist tenderness with some deformity with likely fracture.  X-ray will be done.  X-ray done and does show distal radius and ulnar styloid fracture.  Will immobilize with a sugar-tong splint.  Unfortunately head CT does show large intracranial hemorrhage.  Showed to son discussed and patient would not want extraordinary measures.  Not on anticoagulation.  Discussed with neurosurgery.  Nothing to intervene on.  Would make comfort care and admit to internal medicine.  Patient's son is in agreement with this plan.  Also has forehead laceration that will be closed with Steri-Strips at this time.        Final Clinical Impression(s) / ED Diagnoses Final diagnoses:  Fall, initial encounter  Intracranial hematoma following injury, without loss of consciousness, initial encounter [facility])  Palliative care status    Rx / DC Orders ED Discharge Orders     None         Davonna Belling, MD 08/27/2022 2143

## 2022-08-31 DIAGNOSIS — R0609 Other forms of dyspnea: Secondary | ICD-10-CM | POA: Diagnosis not present

## 2022-08-31 DIAGNOSIS — S06319A Contusion and laceration of right cerebrum with loss of consciousness of unspecified duration, initial encounter: Secondary | ICD-10-CM | POA: Diagnosis not present

## 2022-08-31 DIAGNOSIS — S06319D Contusion and laceration of right cerebrum with loss of consciousness of unspecified duration, subsequent encounter: Secondary | ICD-10-CM

## 2022-08-31 DIAGNOSIS — S062X0A Diffuse traumatic brain injury without loss of consciousness, initial encounter: Secondary | ICD-10-CM | POA: Diagnosis not present

## 2022-08-31 DIAGNOSIS — Z66 Do not resuscitate: Secondary | ICD-10-CM

## 2022-08-31 DIAGNOSIS — Z515 Encounter for palliative care: Secondary | ICD-10-CM | POA: Diagnosis not present

## 2022-08-31 DIAGNOSIS — W19XXXA Unspecified fall, initial encounter: Secondary | ICD-10-CM | POA: Diagnosis not present

## 2022-08-31 MED ORDER — LORAZEPAM 2 MG/ML IJ SOLN: 1.0000 mg | INTRAMUSCULAR | Status: DC | PRN

## 2022-08-31 MED ORDER — MORPHINE SULFATE (PF) 2 MG/ML IV SOLN
1.0000 mg | INTRAVENOUS | Status: DC | PRN
  Administered 2022-08-31 – 2022-09-01 (×4): 1 mg via INTRAVENOUS
  Filled 2022-08-31 (×4): qty 1

## 2022-08-31 MED ORDER — LEVETIRACETAM IN NACL 1000 MG/100ML IV SOLN
1000.0000 mg | Freq: Once | INTRAVENOUS | Status: AC
  Administered 2022-08-31: 1000 mg via INTRAVENOUS
  Filled 2022-08-31: qty 100

## 2022-08-31 NOTE — Progress Notes (Signed)
[facility] PROGRESS NOTE    Progress Note  Rebecca Munoz  JOI:[id number] DOB: 12/18/1920 DOA: 09/09/2022 PCP: Marylynn Pearson, MD     Brief Narrative:   Rebecca Munoz is an 88 y.o. female poor cognitive function and physical functional declining rapidly.  Per son very poor Q0L at baseline especially over the last 9 months fell out of skilled nursing facility bed   Assessment/Plan:   Admission for end of life care/  Intraparenchymal hematoma of brain due to trauma [facility]): Patient with a large traumatic brain bleed with midline shift. The admitting physician confirmed with the son the patient is a DNR/DNI and they would like to move towards comfort measures. Was started on a morphine drip. Social worker consulted as she will need a residential hospice facility.    DVT prophylaxis: scd Family Communication:son Status is: Inpatient Remains inpatient appropriate because: Acute traumatic brain bleed.    Code Status:     Code Status Orders  (From admission, onward)           Start     Ordered   09/04/2022 2244  Do not attempt resuscitation (DNR)  Continuous       Question Answer Comment  In the event of cardiac or respiratory ARREST Do not call a "code blue"   In the event of cardiac or respiratory ARREST Do not perform Intubation, CPR, defibrillation or ACLS   In the event of cardiac or respiratory ARREST Use medication by any route, position, wound care, and other measures to relive pain and suffering. May use oxygen, suction and manual treatment of airway obstruction as needed for comfort.      08/14/2022 2245           Code Status History     Date Active Date Inactive Code Status Order ID Comments User Context   08/29/2022 2143 08/19/2022 2245 DNR [id number]  Davonna Belling, MD ED   07/11/2022 1210 07/11/2022 1956 DNR [id number]  Fredia Sorrow, MD ED   05/14/2018 2245 05/15/2018 1816 DNR [id number]  Vianne Bulls, MD Inpatient   05/14/2018 2011 05/14/2018 2245  DNR [id number]  Drenda Freeze, MD ED      Advance Directive Documentation    Flowsheet Row Most Recent Value  Type of Advance Directive Living will  Pre-existing out of facility DNR order (yellow form or pink MOST form) Pink Most/Yellow Form available - Physician notified to receive inpatient order  "MOST" Form in Place? --         IV Access:   Peripheral IV   Procedures and diagnostic studies:   CT HEAD WO CONTRAST (5MM)  Result Date: 08/14/2022 CLINICAL DATA:  Head and neck trauma EXAM: CT HEAD WITHOUT CONTRAST CT CERVICAL SPINE WITHOUT CONTRAST TECHNIQUE: Multidetector CT imaging of the head and cervical spine was performed following the standard protocol without intravenous contrast. Multiplanar CT image reconstructions of the cervical spine were also generated. RADIATION DOSE REDUCTION: This exam was performed according to the departmental dose-optimization program which includes automated exposure control, adjustment of the mA and/or kV according to patient size and/or use of iterative reconstruction technique. COMPARISON:  07/23/2022 FINDINGS: CT HEAD FINDINGS Brain: Intraparenchymal hemorrhage and subarachnoid hemorrhage centered in the right temporal, occipital, and parietal lobe, with additional subarachnoid hemorrhage in the right frontal lobe (series 3, image 27) and 4 mm subdural hemorrhage overlying the right cerebral convexity (series 5, image 34). The right temporal occipital parietal hematoma measures approximately 5.5 x 2.6 x 5.0 cm (AP x TR  x CC) (approximately 36 mL) (series 6, image 24 and series 5, image 47). Layering within the hematoma, likely hyperacute on acute blood products. Surrounding hypodensity, likely edema, with associated mass effect and 8 mm of right-to-left midline shift. Effacement of the right lateral ventricle. Although the right temporal horn is larger than the left, this appears unchanged from 07/23/2022 Additional trace subarachnoid hemorrhage  in the left frontal (series 3, image 30) and posterior temporal lobes (series 3, image 17 and series 5, image 46), as well as the right cerebellum (series 5, image 49. Periventricular white matter changes, likely the sequela of chronic small vessel ischemic disease. Vascular: No hyperdense vessel. Atherosclerotic calcifications in the intracranial carotid and vertebral arteries. Skull: No acute fracture.  Large left frontal scalp hematoma. Sinuses/Orbits: Mucosal thickening in the paranasal sinuses. Status post bilateral lens replacements. Other: The mastoids are well aerated. CT CERVICAL SPINE FINDINGS Alignment: No listhesis. Skull base and vertebrae: No acute fracture. No primary bone lesion or focal pathologic process. Soft tissues and spinal canal: No prevertebral fluid or swelling. No visible canal hematoma. Disc levels:  Degenerative changes, most prominent C5-C6. Upper chest: Small pulmonary opacities, favored to be infectious or inflammatory. Hypoenhancing nodules in the thyroid, measuring up to 9 mm, for which no follow-up is currently indicated. (Reference: J Am Coll Radiol. 2015 Feb;12(2): 143-50) Other: None. IMPRESSION: 1. Intraparenchymal hematoma centered in the right temporal, occipital, and parietal lobe, with 8 mm of right-to-left midline shift and effacement the right lateral ventricle. 2. Subarachnoid hemorrhage associated with the hematoma as well as in the right frontal lobe, left frontal lobe, and left temporal lobe. 3. 4 mm subdural hemorrhage overlying the right cerebral convexity. 4. No acute fracture or traumatic listhesis in the cervical spine. These results were called by telephone at the time of interpretation on 09/03/2022 at 9:11 pm to provider Davonna Belling , who verbally acknowledged these results. Electronically Signed   By: Merilyn Baba M.D.   On: 08/18/2022 21:11   CT Cervical Spine Wo Contrast  Result Date: 08/25/2022 CLINICAL DATA:  Head and neck trauma EXAM: CT HEAD  WITHOUT CONTRAST CT CERVICAL SPINE WITHOUT CONTRAST TECHNIQUE: Multidetector CT imaging of the head and cervical spine was performed following the standard protocol without intravenous contrast. Multiplanar CT image reconstructions of the cervical spine were also generated. RADIATION DOSE REDUCTION: This exam was performed according to the departmental dose-optimization program which includes automated exposure control, adjustment of the mA and/or kV according to patient size and/or use of iterative reconstruction technique. COMPARISON:  07/23/2022 FINDINGS: CT HEAD FINDINGS Brain: Intraparenchymal hemorrhage and subarachnoid hemorrhage centered in the right temporal, occipital, and parietal lobe, with additional subarachnoid hemorrhage in the right frontal lobe (series 3, image 27) and 4 mm subdural hemorrhage overlying the right cerebral convexity (series 5, image 34). The right temporal occipital parietal hematoma measures approximately 5.5 x 2.6 x 5.0 cm (AP x TR x CC) (approximately 36 mL) (series 6, image 24 and series 5, image 47). Layering within the hematoma, likely hyperacute on acute blood products. Surrounding hypodensity, likely edema, with associated mass effect and 8 mm of right-to-left midline shift. Effacement of the right lateral ventricle. Although the right temporal horn is larger than the left, this appears unchanged from 07/23/2022 Additional trace subarachnoid hemorrhage in the left frontal (series 3, image 30) and posterior temporal lobes (series 3, image 17 and series 5, image 46), as well as the right cerebellum (series 5, image 49. Periventricular white matter changes, likely  the sequela of chronic small vessel ischemic disease. Vascular: No hyperdense vessel. Atherosclerotic calcifications in the intracranial carotid and vertebral arteries. Skull: No acute fracture.  Large left frontal scalp hematoma. Sinuses/Orbits: Mucosal thickening in the paranasal sinuses. Status post bilateral lens  replacements. Other: The mastoids are well aerated. CT CERVICAL SPINE FINDINGS Alignment: No listhesis. Skull base and vertebrae: No acute fracture. No primary bone lesion or focal pathologic process. Soft tissues and spinal canal: No prevertebral fluid or swelling. No visible canal hematoma. Disc levels:  Degenerative changes, most prominent C5-C6. Upper chest: Small pulmonary opacities, favored to be infectious or inflammatory. Hypoenhancing nodules in the thyroid, measuring up to 9 mm, for which no follow-up is currently indicated. (Reference: J Am Coll Radiol. 2015 Feb;12(2): 143-50) Other: None. IMPRESSION: 1. Intraparenchymal hematoma centered in the right temporal, occipital, and parietal lobe, with 8 mm of right-to-left midline shift and effacement the right lateral ventricle. 2. Subarachnoid hemorrhage associated with the hematoma as well as in the right frontal lobe, left frontal lobe, and left temporal lobe. 3. 4 mm subdural hemorrhage overlying the right cerebral convexity. 4. No acute fracture or traumatic listhesis in the cervical spine. These results were called by telephone at the time of interpretation on 09/08/2022 at 9:11 pm to provider Davonna Belling , who verbally acknowledged these results. Electronically Signed   By: Merilyn Baba M.D.   On: 08/12/2022 21:11   DG Chest Portable 1 View  Result Date: 08/28/2022 CLINICAL DATA:  Fall. Altered mental status EXAM: PORTABLE CHEST 1 VIEW COMPARISON:  07/23/2022 FINDINGS: The cardiomediastinal contours are normal. Multiple skin folds project over the right hemithorax with overlying artifact. Pulmonary vasculature is normal. No consolidation, pleural effusion, or pneumothorax. Remote healed left rib fractures no acute osseous abnormalities are seen. IMPRESSION: 1. No acute abnormality. 2. Multiple skin folds project over the right hemithorax with overlying artifact. Electronically Signed   By: Keith Rake M.D.   On: 08/17/2022 20:37   DG  Wrist Complete Left  Result Date: 08/31/2022 CLINICAL DATA:  Fall. EXAM: LEFT WRIST - COMPLETE 3+ VIEW COMPARISON:  None Available. FINDINGS: Comminuted displaced distal radius fracture with apex volar angulation. Fracture extends to the distal radioulnar joint which appears disrupted. There is involvement of the distal radial carpal joint. Minimally displaced ulna styloid fracture which is partially obscured by overlapping structures. No obvious carpal bone fracture. Soft tissue edema seen at the fracture site. IMPRESSION: 1. Comminuted displaced and angulated distal radius fracture extending to the distal radioulnar and radiocarpal joints. 2. Minimally displaced ulna styloid fracture. Electronically Signed   By: Keith Rake M.D.   On: 09/02/2022 20:36     Medical Consultants:   None.   Subjective:    Rebecca Munoz is nonverbal  Objective:    Vitals:   08/31/22 0151 08/31/22 0215 08/31/22 0245 08/31/22 0813  BP:  (!) 181/76 (!) 181/76 (!) 159/99  Pulse: 84 87 86 (!) 110  Resp: (!) 23 (!) 22 20   Temp:  (!) 97.3 F (36.3 C) 97.8 F (36.6 C) 97.8 F (36.6 C)  TempSrc:  Axillary  Oral  SpO2: 96% 98%  95%  Weight:   60 kg   Height:   '5\' 7"'$  (1.702 m)    SpO2: 95 %   Intake/Output Summary (Last 24 hours) at 08/31/2022 0835 Last data filed at 08/31/2022 0800 Gross per 24 hour  Intake 100 ml  Output --  Net 100 ml   Filed Weights   08/24/2022 1917  08/31/22 0245  Weight: 60 kg 60 kg    Exam: General exam: In no acute distress. Respiratory system: Good air movement and clear to auscultation. Cardiovascular system: S1 & S2 heard, RRR. No JVD.  Gastrointestinal system: Abdomen is nondistended, soft and nontender.  Extremities: No pedal edema. Skin: No rashes, lesions or ulcers   Data Reviewed:    Labs: Basic Metabolic Panel: No results for input(s): "NA", "K", "CL", "CO2", "GLUCOSE", "BUN", "CREATININE", "CALCIUM", "MG", "PHOS" in the last 168 hours. GFR CrCl  cannot be calculated (Patient's most recent lab result is older than the maximum 21 days allowed.). Liver Function Tests: No results for input(s): "AST", "ALT", "ALKPHOS", "BILITOT", "PROT", "ALBUMIN" in the last 168 hours. No results for input(s): "LIPASE", "AMYLASE" in the last 168 hours. No results for input(s): "AMMONIA" in the last 168 hours. Coagulation profile No results for input(s): "INR", "PROTIME" in the last 168 hours. COVID-19 Labs  No results for input(s): "DDIMER", "FERRITIN", "LDH", "CRP" in the last 72 hours.  No results found for: "SARSCOV2NAA"  CBC: No results for input(s): "WBC", "NEUTROABS", "HGB", "HCT", "MCV", "PLT" in the last 168 hours. Cardiac Enzymes: No results for input(s): "CKTOTAL", "CKMB", "CKMBINDEX", "TROPONINI" in the last 168 hours. BNP (last 3 results) No results for input(s): "PROBNP" in the last 8760 hours. CBG: No results for input(s): "GLUCAP" in the last 168 hours. D-Dimer: No results for input(s): "DDIMER" in the last 72 hours. Hgb A1c: No results for input(s): "HGBA1C" in the last 72 hours. Lipid Profile: No results for input(s): "CHOL", "HDL", "LDLCALC", "TRIG", "CHOLHDL", "LDLDIRECT" in the last 72 hours. Thyroid function studies: No results for input(s): "TSH", "T4TOTAL", "T3FREE", "THYROIDAB" in the last 72 hours.  Invalid input(s): "FREET3" Anemia work up: No results for input(s): "VITAMINB12", "FOLATE", "FERRITIN", "TIBC", "IRON", "RETICCTPCT" in the last 72 hours. Sepsis Labs: No results for input(s): "PROCALCITON", "WBC", "LATICACIDVEN" in the last 168 hours. Microbiology No results found for this or any previous visit (from the past 240 hour(s)).   Medications:    Continuous Infusions:  morphine        LOS: 1 day   Charlynne Cousins  [facility]  08/31/2022, 8:35 AM

## 2022-08-31 NOTE — Progress Notes (Signed)
Pt arrived to floor unresponsive with agonal, apnea breathing.  Son at bedside, assessment completed with Zandra Abts.  Son is very aware of patients decline in health and on Pallative care while on this unit.  This Probation officer spoke with son about medications for end of life, he stated he wanted to give a little time for his sister to arrive from [place] to see his mother, but if she was showed signs of suffering at anytime, please give medications per orders.  POC will be changed per patients future condition.  Pt now resting quietly in low, locked bed, call light within reach.

## 2022-08-31 NOTE — H&P (Signed)
History and Physical    Patient: Rebecca Munoz JKK:[id number] DOB: March 16, 1921 DOA: 09/02/2022 DOS: the patient was seen and examined on 08/31/2022 PCP: Marylynn Pearson, MD  Patient coming from: Home  Chief Complaint:  Chief Complaint  Patient presents with   Fall   HPI: Rebecca Munoz is a 88 y.o. female with medical history significant of osteoporosis, cognitive and functional decline.  Per son very poor QOL at baseline especially over past 9 months.  Pt reportedly had fall at SNF / fell out of bed.  Pt unable to provide any history secondary to mental status.  Review of Systems: unable to review all systems due to the inability of the patient to answer questions. Past Medical History:  Diagnosis Date   Cancer ([place])    breast   Cognitive decline    Fracture, humerus    right   Osteoporosis    Seasonal allergies    Past Surgical History:  Procedure Laterality Date   APPENDECTOMY     BACK SURGERY     BREAST SURGERY Right    lumpectomy    EYE SURGERY     JOINT REPLACEMENT     MASTECTOMY     Social History:  reports that she has never smoked. She has never used smokeless tobacco. She reports that she does not drink alcohol and does not use drugs.  Allergies  Allergen Reactions   Tape Other (See Comments)    Skin TEARS AND BRUISES VERY EASILY    Family History  Problem Relation Age of Onset   Heart disease Mother    Colon cancer Father     Prior to Admission medications   Medication Sig Start Date End Date Taking? Authorizing Provider  acetaminophen (TYLENOL) 325 MG tablet Take 1 tablet (325 mg total) by mouth every 4 (four) hours as needed for mild pain. 07/23/22   Isla Pence, MD  Calcium Carb-Cholecalciferol (CALCIUM + D3 PO) Take 1 tablet by mouth daily with breakfast.    [provider]  fluticasone (FLONASE) 50 MCG/ACT nasal spray SHAKE LIQUID AND USE 2 SPRAYS IN EACH NOSTRIL DAILY Patient not taking: Reported on 07/23/2022 01/05/21   Maximiano Coss, NP  hydrocortisone cream 1 % Apply to affected area 2 times daily Patient not taking: Reported on 07/23/2022 07/01/22   Jaynee Eagles, PA-C  loratadine (CLARITIN) 10 MG tablet TAKE 1 TABLET BY MOUTH EVERY DAY Patient not taking: Reported on 07/23/2022 01/18/18   Tenna Delaine D, PA-C  Multiple Vitamins-Minerals (ONE-A-DAY WOMENS 50+) TABS Take 1 tablet by mouth daily with breakfast.    [provider]  omeprazole (PRILOSEC) 40 MG capsule TAKE 1 CAPSULE(40 MG) BY MOUTH DAILY Patient taking differently: Take 40 mg by mouth daily before breakfast. 10/28/21   Maximiano Coss, NP  potassium chloride SA (KLOR-CON M) 20 MEQ tablet Take 1 tablet (20 mEq total) by mouth 2 (two) times daily. Patient not taking: Reported on 07/23/2022 07/11/22   Fredia Sorrow, MD  sucralfate (CARAFATE) 1 g tablet TAKE 1 TABLET(1 GRAM) BY MOUTH DAILY WITH BREAKFAST Patient not taking: Reported on 07/23/2022 11/18/20   Maximiano Coss, NP    Physical Exam: Vitals:   08/23/2022 2015 08/19/2022 2019 08/28/2022 2115 08/11/2022 2145  BP:   (!) 185/65 (!) 164/95  Pulse: 87 81 86 (!) 132  Resp: (!) '22 15 18 16  '$ Temp:      TempSrc:      SpO2: 98% 97% 98% 94%  Weight:       Constitutional:  lethargic Eyes: PERRL, lids and conjunctivae normal ENMT: Mucous membranes are moist. Posterior pharynx clear of any exudate or lesions.Normal dentition.  Neck: normal, supple, no masses, no thyromegaly Respiratory: clear to auscultation bilaterally, no wheezing, no crackles. Normal respiratory effort. No accessory muscle use.  Cardiovascular: Regular rate and rhythm, no murmurs / rubs / gallops. No extremity edema. 2+ pedal pulses. No carotid bruits.  Abdomen: no tenderness, no masses palpated. No hepatosplenomegaly. Bowel sounds positive.  Musculoskeletal: no clubbing / cyanosis. No joint deformity upper and lower extremities. Good ROM, no contractures. Normal muscle tone.  Skin: no rashes, lesions, ulcers. No  induration Neurologic: Lethargic, not really responding, sonorous respirations, seems to localize to noxious stimuli, no eye opening, GCS of 9 Psychiatric: Unable  Data Reviewed:    CT head: IMPRESSION: 1. Intraparenchymal hematoma centered in the right temporal, occipital, and parietal lobe, with 8 mm of right-to-left midline shift and effacement the right lateral ventricle. 2. Subarachnoid hemorrhage associated with the hematoma as well as in the right frontal lobe, left frontal lobe, and left temporal lobe. 3. 4 mm subdural hemorrhage overlying the right cerebral convexity. 4. No acute fracture or traumatic listhesis in the cervical spine.    Assessment and Plan: * Admission for end of life care Pt with large traumatic brain bleed, midline shift.  Pt DNR/DNI and comfort measures confirmed with Son at bedside.  Pt not candidate for surgical intervention per NS (nor does it sound like she / family would want this). Goals of care = patient comfort Morphine gtt ordered if needed, PRN boluses Pal care consult in AM for consideration of hospice. Very possible that she passes before then though, her exam seems to have gotten worse even since coming in a couple hours ago with worse GCS  Intraparenchymal hematoma of brain due to trauma [facility]) Large bleed: also with SAH, SDH and midline shift as described in CT      Advance Care Planning:   Code Status: DNR  Consults: EDP curbsided NS  Family Communication: Son at bedside  Severity of Illness: The appropriate patient status for this patient is INPATIENT. Inpatient status is judged to be reasonable and necessary in order to provide the required intensity of service to ensure the patient's safety. The patient's presenting symptoms, physical exam findings, and initial radiographic and laboratory data in the context of their chronic comorbidities is felt to place them at high risk for further clinical deterioration. Furthermore, it is not  anticipated that the patient will be medically stable for discharge from the hospital within 2 midnights of admission.   * I certify that at the point of admission it is my clinical judgment that the patient will require inpatient hospital care spanning beyond 2 midnights from the point of admission due to high intensity of service, high risk for further deterioration and high frequency of surveillance required.*  Author: Etta Quill., DO 08/31/2022 12:19 AM  For on call review [email or web address].

## 2022-08-31 NOTE — Consult Note (Signed)
Consultation Note Date: 08/31/2022   Patient Name: Rebecca Munoz  DOB: November 26, 1920  MRN: [id number]  Age / Sex: 88 y.o., female  PCP: Marylynn Pearson, MD Referring Physician: Charlynne Cousins, MD  Reason for Consultation: Establishing goals of care and Psychosocial/spiritual support  HPI/Patient Profile: 88 y.o. female  admitted on 09/09/2022 with   large traumatic brain bleed, midline shift 2/2 to suspected fall.     Resident  of [facility]/independent living .  Family report continued physical, functional and cognitive decline over the past several months.  Family reports the patient has been at [facility] for less than 1 month  In conversation with attending family has made decision that focus of care is comfort, quality and dignity.  Palliative medicine consult for recommendations for end-of-life care.   Family face treatment option decisions, advanced directive decisions and anticipatory care needs.  Clinical Assessment and Goals of Care:  This NP Wadie Lessen reviewed medical records, received report from team, assessed the patient and then meet at the patient's bedside along with her son/Rebecca Munoz and his wife and son to discuss diagnosis, prognosis, GOC, EOL wishes disposition and options.   Concept of Palliative Care was introduced as specialized medical care for people and their families living with serious illness.  If focuses on providing relief from the symptoms and stress of a serious illness.  The goal is to improve quality of life for both the patient and the family.  Values and goals of care important to patient and family were attempted to be elicited.  Created space and opportunity for family to explore thoughts and feelings regarding current medical situation.    Family at bedside verbalized clear understanding that patient's prognosis is limited and that comfort is the main  focus of care.    There is a daughter and other family members coming in from out of town, all hope that they will have the chance to patient to save her goodbyes.     Education offered on the difference between a aggressive medical intervention path  and a palliative comfort care path for this patient at this time in this situation  was had.     Education offered on utilization of medication to enhance comfort at end-of-life.  Family is in agreement for utilization of as needed medications for symptom management.      Natural trajectory and expectations at EOL were discussed.  Emotional support offered  Questions and concerns addressed.  Family  encouraged to call with questions or concerns.    Education offered on hospice benefit; philosophy and eligibility.  Detailed residential hospice benefit.      PMT will continue to support holistically.   No documented H POA or advanced care planning documents on file.  NEXT OF KIN    SUMMARY OF RECOMMENDATIONS    Code Status/Advance Care Planning: DNR Comfort, quality and dignity are focus of care allowing for natural death   Symptom Management:  Morphine/Ativan / Rubinol/Zofran  - see MAR  Palliative Prophylaxis:  Aspiration,  Delirium Protocol, Frequent Pain Assessment, and Oral Care  Additional Recommendations (Limitations, Scope, Preferences): Full Comfort Care  Psycho-social/Spiritual:  Desire for further Chaplaincy support:yes Additional Recommendations: Education on Hospice  Prognosis:  Hours - Days  Discharge Planning:  Will reassess patient again in the morning for transition of care options.  Anticipate other family members to bedside later this afternoon To Be Determined      Primary Diagnoses: Present on Admission:  Intraparenchymal hematoma of brain due to trauma [facility])   I have reviewed the medical record, interviewed the patient and family, and examined the patient. The following aspects are  pertinent.  Past Medical History:  Diagnosis Date   Cancer [facility])    breast   Cognitive decline    Fracture, humerus    right   Osteoporosis    Seasonal allergies    Social History   Socioeconomic History   Marital status: Widowed    Spouse name: Not on file   Number of children: 2   Years of education: three semesters of college   Highest education level: Not on file  Occupational History   Occupation: Retired  Tobacco Use   Smoking status: Never   Smokeless tobacco: Never  Vaping Use   Vaping Use: Never used  Substance and Sexual Activity   Alcohol use: No    Alcohol/week: 0.0 standard drinks of alcohol   Drug use: No   Sexual activity: Not Currently  Other Topics Concern   Not on file  Social History Narrative   She resides in an assisted living facility.  She has staff that helps her.   Right-handed.   Caffeine use:  No daily use.   Two adopted children.   Social Determinants of Health   Financial Resource Strain: Not on file  Food Insecurity: Not on file  Transportation Needs: Not on file  Physical Activity: Not on file  Stress: Not on file  Social Connections: Not on file   Family History  Problem Relation Age of Onset   Heart disease Mother    Colon cancer Father    Scheduled Meds: Continuous Infusions:  morphine     PRN Meds:.acetaminophen **OR** acetaminophen, antiseptic oral rinse, glycopyrrolate **OR** glycopyrrolate **OR** glycopyrrolate, haloperidol **OR** haloperidol **OR** haloperidol lactate, morphine, ondansetron **OR** ondansetron (ZOFRAN) IV, polyvinyl alcohol Medications Prior to Admission:  Prior to Admission medications   Medication Sig Start Date End Date Taking? Authorizing Provider  acetaminophen (TYLENOL) 325 MG tablet Take 1 tablet (325 mg total) by mouth every 4 (four) hours as needed for mild pain. 07/23/22   Isla Pence, MD  Calcium Carb-Cholecalciferol (CALCIUM + D3 PO) Take 1 tablet by mouth daily with breakfast.     [provider]  fluticasone (FLONASE) 50 MCG/ACT nasal spray SHAKE LIQUID AND USE 2 SPRAYS IN [facility] NOSTRIL DAILY Patient not taking: Reported on 07/23/2022 01/05/21   Maximiano Coss, NP  hydrocortisone cream 1 % Apply to affected area 2 times daily Patient not taking: Reported on 07/23/2022 07/01/22   Jaynee Eagles, PA-C  loratadine (CLARITIN) 10 MG tablet TAKE 1 TABLET BY MOUTH EVERY DAY Patient not taking: Reported on 07/23/2022 01/18/18   Tenna Delaine D, PA-C  Multiple Vitamins-Minerals (ONE-A-DAY WOMENS 50+) TABS Take 1 tablet by mouth daily with breakfast.    [provider]  omeprazole (PRILOSEC) 40 MG capsule TAKE 1 CAPSULE(40 MG) BY MOUTH DAILY Patient taking differently: Take 40 mg by mouth daily before breakfast. 10/28/21   Maximiano Coss, NP  potassium chloride SA (  KLOR-CON M) 20 MEQ tablet Take 1 tablet (20 mEq total) by mouth 2 (two) times daily. Patient not taking: Reported on 07/23/2022 07/11/22   Fredia Sorrow, MD  sucralfate (CARAFATE) 1 g tablet TAKE 1 TABLET(1 GRAM) BY MOUTH DAILY WITH BREAKFAST Patient not taking: Reported on 07/23/2022 11/18/20   Maximiano Coss, NP   Allergies  Allergen Reactions   Tape Other (See Comments)    Skin TEARS AND BRUISES VERY EASILY   Review of Systems  Unable to perform ROS: Patient unresponsive    Physical Exam Constitutional:      Appearance: She is normal weight. She is ill-appearing.  Cardiovascular:     Rate and Rhythm: Tachycardia present.  Pulmonary:     Effort: Pulmonary effort is normal.     Comments: -[place]      Vital Signs: BP (!) 159/99   Pulse (!) 110   Temp 97.8 F (36.6 C) (Oral)   Resp 20   Ht '5\' 7"'$  (1.702 m)   Wt 60 kg   SpO2 95%   BMI 20.72 kg/m  Pain Scale: PAINAD       SpO2: SpO2: 95 % O2 Device:SpO2: 95 % O2 Flow Rate: .   IO: Intake/output summary:  Intake/Output Summary (Last 24 hours) at 08/31/2022 0934 Last data filed at 08/31/2022 0800 Gross per 24 hour   Intake 100 ml  Output --  Net 100 ml    LBM:   Baseline Weight: Weight: 60 kg Most recent weight: Weight: 60 kg     Palliative Assessment/Data: 10 %    Discussed with Dr Venetia Constable and bedside RN   Time In: 0800 Time Out: 0915 Time Total: 75 minutes Greater than 50%  of this time was spent counseling and coordinating care related to the above assessment and plan.  Signed by: Wadie Lessen, NP   Please contact Palliative Medicine Team phone at [phone] for questions and concerns.  For individual provider: See Shea Evans

## 2022-08-31 NOTE — Progress Notes (Addendum)
This chaplain responded to PMT NP-Mary consult for family EOL spiritual care. The Pt. son-Rebecca Munoz and daughter in law are at the bedside. The Pt. preferred name is "Rebecca Munoz'.  After the chaplain's introduction, the family accepted the invitation for story telling. The chaplain understands the Pt. considers herself an evangelical Christian, living her life with the vision of eternal life. The Pt. personality through stories, fits the description of the "Greatest Generation" in the setting of WWII with the addition of being blunt in her conversation.  Prayer was shared with the Pt.  The chaplain understands the Pt. daughter will be visiting later today. Education was provided on how to reach a chaplain through the RN. The Pt. son is grateful for the visit and will update the Pt. daughter-Rebecca Munoz.  The chaplain updated the Pt. RN-Sam of the visit.  Chaplain Sallyanne Kuster [phone]

## 2022-09-01 DIAGNOSIS — W06XXXA Fall from bed, initial encounter: Secondary | ICD-10-CM

## 2022-09-01 DIAGNOSIS — R41841 Cognitive communication deficit: Secondary | ICD-10-CM

## 2022-09-01 DIAGNOSIS — S52502A Unspecified fracture of the lower end of left radius, initial encounter for closed fracture: Secondary | ICD-10-CM | POA: Diagnosis not present

## 2022-09-01 DIAGNOSIS — R54 Age-related physical debility: Secondary | ICD-10-CM

## 2022-09-01 DIAGNOSIS — S062X0A Diffuse traumatic brain injury without loss of consciousness, initial encounter: Secondary | ICD-10-CM | POA: Diagnosis not present

## 2022-09-01 DIAGNOSIS — W19XXXA Unspecified fall, initial encounter: Secondary | ICD-10-CM | POA: Diagnosis not present

## 2022-09-01 DIAGNOSIS — Z515 Encounter for palliative care: Secondary | ICD-10-CM | POA: Diagnosis not present

## 2022-09-01 DIAGNOSIS — S0636AA Traumatic hemorrhage of cerebrum, unspecified, with loss of consciousness status unknown, initial encounter: Secondary | ICD-10-CM

## 2022-09-01 MED ORDER — GLYCOPYRROLATE 1 MG PO TABS: 1.0000 mg | ORAL_TABLET | ORAL | Status: DC | PRN

## 2022-09-01 MED ORDER — GLYCOPYRROLATE 0.2 MG/ML IJ SOLN
0.3000 mg | INTRAMUSCULAR | Status: DC | PRN
  Administered 2022-09-01: 0.3 mg via INTRAVENOUS
  Filled 2022-09-01: qty 2

## 2022-09-10 NOTE — Progress Notes (Signed)
Time of death 0607. Alerted by family to check on mother. Pt is apneic at this time.  Conformation of death with stethoscope and verified with another RN, Mahima. On call NP,  Clarene Essex is aware.  Daughter stated that pts body is going to be donated for research.  Family at bedside is waiting on her son to arrive.

## 2022-09-10 NOTE — Progress Notes (Addendum)
This RN assisting with patient's whole body donation request post mortem. Patient is a Quarry manager case due to cause for coming to the hospital, unwitnessed fall.   This RN spoke with Electrical engineer. Plan for body to be processed and then patient placement will call [facility] to pick up the body this afternoon. [phone])  This RN called NOK, son Quillian Quince, and notified of process, they are understanding and appreciative of assistance. Discussed plan with bedside RN. Paperwork provided for [facility] donation with body, family with copy as well.    Robbi Garter, MSN Palliative Medicine Team  [phone]

## 2022-09-10 NOTE — Discharge Summary (Signed)
Death Summary  Rebecca Munoz UXL:[id number] DOB: 1921/04/06 DOA: 09-21-22  PCP: Marylynn Pearson, MD  Admit date: 2022/09/21 Date of Death: Sep 30, 2022 Time of Death: 0607 Notification: Marylynn Pearson, MD notified of death of 2022-09-30   History of present illness:  Rebecca Munoz is a 88 y.o. female with  poor cognitive function and physical functional declining rapidly.  Per son very poor cognition at baseline especially over the last 9 months fell out of skilled nursing facility bed   Final Diagnoses:  1.   Intraparenchymal hematoma of the brain due to trauma: Secondary to mechanical fall. The admitting physician confirm with the son the patient was a DNR and DNI due to her poor prognosis which was explained to them by the admitting physician the wanted to move towards comfort care. She was placed on morphine drip, when I spoke with the patient son the next day he was comfortable with decision is sibling were driving down to see her mother and stated her last goodbyes.  Admission for end-of-life care: She was placed on morphine drip palliative care was consulted and they confirm that the family wanted to focus on comfort measures.    The results of significant diagnostics from this hospitalization (including imaging, microbiology, ancillary and laboratory) are listed below for reference.    Significant Diagnostic Studies: CT HEAD WO CONTRAST (5MM)  Result Date: 09/21/22 CLINICAL DATA:  Head and neck trauma EXAM: CT HEAD WITHOUT CONTRAST CT CERVICAL SPINE WITHOUT CONTRAST TECHNIQUE: Multidetector CT imaging of the head and cervical spine was performed following the standard protocol without intravenous contrast. Multiplanar CT image reconstructions of the cervical spine were also generated. RADIATION DOSE REDUCTION: This exam was performed according to the departmental dose-optimization program which includes automated exposure control, adjustment of the mA and/or kV according to  patient size and/or use of iterative reconstruction technique. COMPARISON:  07/23/2022 FINDINGS: CT HEAD FINDINGS Brain: Intraparenchymal hemorrhage and subarachnoid hemorrhage centered in the right temporal, occipital, and parietal lobe, with additional subarachnoid hemorrhage in the right frontal lobe (series 3, image 27) and 4 mm subdural hemorrhage overlying the right cerebral convexity (series 5, image 34). The right temporal occipital parietal hematoma measures approximately 5.5 x 2.6 x 5.0 cm (AP x TR x CC) (approximately 36 mL) (series 6, image 24 and series 5, image 47). Layering within the hematoma, likely hyperacute on acute blood products. Surrounding hypodensity, likely edema, with associated mass effect and 8 mm of right-to-left midline shift. Effacement of the right lateral ventricle. Although the right temporal horn is larger than the left, this appears unchanged from 07/23/2022 Additional trace subarachnoid hemorrhage in the left frontal (series 3, image 30) and posterior temporal lobes (series 3, image 17 and series 5, image 46), as well as the right cerebellum (series 5, image 49. Periventricular white matter changes, likely the sequela of chronic small vessel ischemic disease. Vascular: No hyperdense vessel. Atherosclerotic calcifications in the intracranial carotid and vertebral arteries. Skull: No acute fracture.  Large left frontal scalp hematoma. Sinuses/Orbits: Mucosal thickening in the paranasal sinuses. Status post bilateral lens replacements. Other: The mastoids are well aerated. CT CERVICAL SPINE FINDINGS Alignment: No listhesis. Skull base and vertebrae: No acute fracture. No primary bone lesion or focal pathologic process. Soft tissues and spinal canal: No prevertebral fluid or swelling. No visible canal hematoma. Disc levels:  Degenerative changes, most prominent C5-C6. Upper chest: Small pulmonary opacities, favored to be infectious or inflammatory. Hypoenhancing nodules in the  thyroid, measuring up to 9 mm, for which  no follow-up is currently indicated. (Reference: J Am Coll Radiol. 2015 Feb;12(2): 143-50) Other: None. IMPRESSION: 1. Intraparenchymal hematoma centered in the right temporal, occipital, and parietal lobe, with 8 mm of right-to-left midline shift and effacement the right lateral ventricle. 2. Subarachnoid hemorrhage associated with the hematoma as well as in the right frontal lobe, left frontal lobe, and left temporal lobe. 3. 4 mm subdural hemorrhage overlying the right cerebral convexity. 4. No acute fracture or traumatic listhesis in the cervical spine. These results were called by telephone at the time of interpretation on 08/19/2022 at 9:11 pm to provider Davonna Belling , who verbally acknowledged these results. Electronically Signed   By: Merilyn Baba M.D.   On: 08/22/2022 21:11   CT Cervical Spine Wo Contrast  Result Date: 08/29/2022 CLINICAL DATA:  Head and neck trauma EXAM: CT HEAD WITHOUT CONTRAST CT CERVICAL SPINE WITHOUT CONTRAST TECHNIQUE: Multidetector CT imaging of the head and cervical spine was performed following the standard protocol without intravenous contrast. Multiplanar CT image reconstructions of the cervical spine were also generated. RADIATION DOSE REDUCTION: This exam was performed according to the departmental dose-optimization program which includes automated exposure control, adjustment of the mA and/or kV according to patient size and/or use of iterative reconstruction technique. COMPARISON:  07/23/2022 FINDINGS: CT HEAD FINDINGS Brain: Intraparenchymal hemorrhage and subarachnoid hemorrhage centered in the right temporal, occipital, and parietal lobe, with additional subarachnoid hemorrhage in the right frontal lobe (series 3, image 27) and 4 mm subdural hemorrhage overlying the right cerebral convexity (series 5, image 34). The right temporal occipital parietal hematoma measures approximately 5.5 x 2.6 x 5.0 cm (AP x TR x CC)  (approximately 36 mL) (series 6, image 24 and series 5, image 47). Layering within the hematoma, likely hyperacute on acute blood products. Surrounding hypodensity, likely edema, with associated mass effect and 8 mm of right-to-left midline shift. Effacement of the right lateral ventricle. Although the right temporal horn is larger than the left, this appears unchanged from 07/23/2022 Additional trace subarachnoid hemorrhage in the left frontal (series 3, image 30) and posterior temporal lobes (series 3, image 17 and series 5, image 46), as well as the right cerebellum (series 5, image 49. Periventricular white matter changes, likely the sequela of chronic small vessel ischemic disease. Vascular: No hyperdense vessel. Atherosclerotic calcifications in the intracranial carotid and vertebral arteries. Skull: No acute fracture.  Large left frontal scalp hematoma. Sinuses/Orbits: Mucosal thickening in the paranasal sinuses. Status post bilateral lens replacements. Other: The mastoids are well aerated. CT CERVICAL SPINE FINDINGS Alignment: No listhesis. Skull base and vertebrae: No acute fracture. No primary bone lesion or focal pathologic process. Soft tissues and spinal canal: No prevertebral fluid or swelling. No visible canal hematoma. Disc levels:  Degenerative changes, most prominent C5-C6. Upper chest: Small pulmonary opacities, favored to be infectious or inflammatory. Hypoenhancing nodules in the thyroid, measuring up to 9 mm, for which no follow-up is currently indicated. (Reference: J Am Coll Radiol. 2015 Feb;12(2): 143-50) Other: None. IMPRESSION: 1. Intraparenchymal hematoma centered in the right temporal, occipital, and parietal lobe, with 8 mm of right-to-left midline shift and effacement the right lateral ventricle. 2. Subarachnoid hemorrhage associated with the hematoma as well as in the right frontal lobe, left frontal lobe, and left temporal lobe. 3. 4 mm subdural hemorrhage overlying the right  cerebral convexity. 4. No acute fracture or traumatic listhesis in the cervical spine. These results were called by telephone at the time of interpretation on 09/05/2022 at 9:11 pm  to provider Davonna Belling , who verbally acknowledged these results. Electronically Signed   By: Merilyn Baba M.D.   On: 08/20/2022 21:11   DG Chest Portable 1 View  Result Date: 09/02/2022 CLINICAL DATA:  Fall. Altered mental status EXAM: PORTABLE CHEST 1 VIEW COMPARISON:  07/23/2022 FINDINGS: The cardiomediastinal contours are normal. Multiple skin folds project over the right hemithorax with overlying artifact. Pulmonary vasculature is normal. No consolidation, pleural effusion, or pneumothorax. Remote healed left rib fractures no acute osseous abnormalities are seen. IMPRESSION: 1. No acute abnormality. 2. Multiple skin folds project over the right hemithorax with overlying artifact. Electronically Signed   By: Keith Rake M.D.   On: 08/21/2022 20:37   DG Wrist Complete Left  Result Date: 08/16/2022 CLINICAL DATA:  Fall. EXAM: LEFT WRIST - COMPLETE 3+ VIEW COMPARISON:  None Available. FINDINGS: Comminuted displaced distal radius fracture with apex volar angulation. Fracture extends to the distal radioulnar joint which appears disrupted. There is involvement of the distal radial carpal joint. Minimally displaced ulna styloid fracture which is partially obscured by overlapping structures. No obvious carpal bone fracture. Soft tissue edema seen at the fracture site. IMPRESSION: 1. Comminuted displaced and angulated distal radius fracture extending to the distal radioulnar and radiocarpal joints. 2. Minimally displaced ulna styloid fracture. Electronically Signed   By: Keith Rake M.D.   On: 09/04/2022 20:36    Microbiology: No results found for this or any previous visit (from the past 240 hour(s)).   Labs: Basic Metabolic Panel: No results for input(s): "NA", "K", "CL", "CO2", "GLUCOSE", "BUN",  "CREATININE", "CALCIUM", "MG", "PHOS" in the last 168 hours. Liver Function Tests: No results for input(s): "AST", "ALT", "ALKPHOS", "BILITOT", "PROT", "ALBUMIN" in the last 168 hours. No results for input(s): "LIPASE", "AMYLASE" in the last 168 hours. No results for input(s): "AMMONIA" in the last 168 hours. CBC: No results for input(s): "WBC", "NEUTROABS", "HGB", "HCT", "MCV", "PLT" in the last 168 hours. Cardiac Enzymes: No results for input(s): "CKTOTAL", "CKMB", "CKMBINDEX", "TROPONINI" in the last 168 hours. D-Dimer No results for input(s): "DDIMER" in the last 72 hours. BNP: Invalid input(s): "POCBNP" CBG: No results for input(s): "GLUCAP" in the last 168 hours. Anemia work up No results for input(s): "VITAMINB12", "FOLATE", "FERRITIN", "TIBC", "IRON", "RETICCTPCT" in the last 72 hours. Urinalysis    Component Value Date/Time   COLORURINE YELLOW 07/23/2022 2220   APPEARANCEUR CLEAR 07/23/2022 2220   LABSPEC 1.017 07/23/2022 2220   PHURINE 6.0 07/23/2022 2220   GLUCOSEU NEGATIVE 07/23/2022 2220   HGBUR NEGATIVE 07/23/2022 2220   BILIRUBINUR NEGATIVE 07/23/2022 2220   BILIRUBINUR negative 07/01/2022 1053   KETONESUR 20 (A) 07/23/2022 2220   PROTEINUR NEGATIVE 07/23/2022 2220   UROBILINOGEN 0.2 07/01/2022 1053   NITRITE NEGATIVE 07/23/2022 2220   LEUKOCYTESUR NEGATIVE 07/23/2022 2220   Sepsis Labs No results for input(s): "WBC" in the last 168 hours.  Invalid input(s): "PROCALCITONIN", "LACTICIDVEN"     SIGNED:  Charlynne Cousins, MD  [facility] 09/08/2022, 12:09 PM Pager   If 7PM-7AM, please contact night-coverage [email or web address] Password [email or web address]

## 2022-09-10 NOTE — Progress Notes (Signed)
Patient ID: Areya Lemmerman, female   DOB: [birth year], 88 y.o.   MRN: [id number]    Progress Note from the Palliative Medicine Team at [facility]   Patient Name: Jaclin Finks        Date: Oct 01, 2022 DOB: 1921-09-09  Age: 88 y.o. MRN#: [id number] Attending Physician: Debbe Odea, MD Primary Care Physician: Marylynn Pearson, MD Admit Date: 09/05/2022   Medical records reviewed    88 y.o. female  admitted on 08/22/2022 with   large traumatic brain bleed, midline shift 2/2 to suspected fall.     Resident  of [facility]/independent living .   Family report continued physical, functional and cognitive decline over the past several months.  Family reports the patient has been at [facility] for less than 1 month   In conversation with attending family has made decision that focus of care is comfort, quality and dignity.   Palliative medicine consult for recommendations for end-of-life care.    Family face treatment option decisions, advanced directive decisions and anticipatory care needs.     This NP assessed patient at the bedside as a follow up to  yesterday's [place].        Education offered today regarding  the importance of continued conversation with family and their  medical providers regarding overall plan of care and treatment options,  ensuring decisions are within the context of the patients values and GOCs.  Questions and concerns addressed   Discussed with Dr   Wadie Lessen NP  Palliative Medicine Team Team Phone # [phone] Pager [phone]

## 2022-09-10 NOTE — Progress Notes (Signed)
Family at bedside with patient. Dr. Wynelle [place] made aware of time of death. Waiting for family to make arrangements.

## 2022-09-10 NOTE — Progress Notes (Signed)
    OVERNIGHT PROGRESS REPORT  Notified by RN that patient has expired at 0607 hrs.  Patient was DNR followed by palliative care. 2 RN verified.  Family was present and available with RN.     Gershon Cull MSNA ACNPC-AG Acute Care Nurse Practitioner [place]

## 2022-09-10 DEATH — deceased
